# Patient Record
Sex: Male | Born: 1965
Health system: Southern US, Community
[De-identification: ages and names within clinical notes are randomized; demographics above are authoritative.]

## PROBLEM LIST (undated history)

## (undated) DIAGNOSIS — R7989 Other specified abnormal findings of blood chemistry: Secondary | ICD-10-CM

## (undated) DIAGNOSIS — F909 Attention-deficit hyperactivity disorder, unspecified type: Secondary | ICD-10-CM

## (undated) DIAGNOSIS — E785 Hyperlipidemia, unspecified: Secondary | ICD-10-CM

## (undated) DIAGNOSIS — N529 Male erectile dysfunction, unspecified: Secondary | ICD-10-CM

## (undated) DIAGNOSIS — R945 Abnormal results of liver function studies: Secondary | ICD-10-CM

## (undated) DIAGNOSIS — D569 Thalassemia, unspecified: Secondary | ICD-10-CM

## (undated) DIAGNOSIS — K8020 Calculus of gallbladder without cholecystitis without obstruction: Secondary | ICD-10-CM

## (undated) DIAGNOSIS — E349 Endocrine disorder, unspecified: Secondary | ICD-10-CM

## (undated) HISTORY — DX: Endocrine disorder, unspecified: E34.9

## (undated) HISTORY — PX: HEMORRHOID BANDING: SHX5850

## (undated) HISTORY — PX: HIP ARTHROSCOPY: SHX668

## (undated) HISTORY — PX: CHOLECYSTECTOMY: SHX55

## (undated) HISTORY — PX: VASECTOMY: SHX75

## (undated) HISTORY — DX: Other specified abnormal findings of blood chemistry: R79.89

## (undated) HISTORY — PX: SINUS EXPLORATION: SHX5214

## (undated) HISTORY — DX: Calculus of gallbladder without cholecystitis without obstruction: K80.20

## (undated) HISTORY — DX: Abnormal results of liver function studies: R94.5

## (undated) HISTORY — DX: Hyperlipidemia, unspecified: E78.5

---

## 2009-04-29 ENCOUNTER — Emergency Department (HOSPITAL_COMMUNITY): Admission: EM | Admit: 2009-04-29 | Discharge: 2009-04-29 | Payer: Self-pay | Admitting: Emergency Medicine

## 2013-04-27 ENCOUNTER — Encounter (HOSPITAL_COMMUNITY): Payer: Self-pay | Admitting: Emergency Medicine

## 2013-04-27 ENCOUNTER — Emergency Department (HOSPITAL_COMMUNITY)
Admission: EM | Admit: 2013-04-27 | Discharge: 2013-04-28 | Disposition: A | Discharge status: Home / Self Care | Payer: PRIVATE HEALTH INSURANCE | Attending: Emergency Medicine | Admitting: Emergency Medicine

## 2013-04-27 ENCOUNTER — Emergency Department (HOSPITAL_COMMUNITY): Payer: PRIVATE HEALTH INSURANCE

## 2013-04-27 DIAGNOSIS — R0602 Shortness of breath: Secondary | ICD-10-CM | POA: Insufficient documentation

## 2013-04-27 DIAGNOSIS — R109 Unspecified abdominal pain: Secondary | ICD-10-CM | POA: Insufficient documentation

## 2013-04-27 DIAGNOSIS — F909 Attention-deficit hyperactivity disorder, unspecified type: Secondary | ICD-10-CM | POA: Insufficient documentation

## 2013-04-27 DIAGNOSIS — N529 Male erectile dysfunction, unspecified: Secondary | ICD-10-CM | POA: Insufficient documentation

## 2013-04-27 DIAGNOSIS — Z79899 Other long term (current) drug therapy: Secondary | ICD-10-CM | POA: Insufficient documentation

## 2013-04-27 DIAGNOSIS — Z862 Personal history of diseases of the blood and blood-forming organs and certain disorders involving the immune mechanism: Secondary | ICD-10-CM | POA: Insufficient documentation

## 2013-04-27 DIAGNOSIS — R079 Chest pain, unspecified: Secondary | ICD-10-CM

## 2013-04-27 DIAGNOSIS — R0789 Other chest pain: Secondary | ICD-10-CM | POA: Insufficient documentation

## 2013-04-27 HISTORY — DX: Attention-deficit hyperactivity disorder, unspecified type: F90.9

## 2013-04-27 HISTORY — DX: Male erectile dysfunction, unspecified: N52.9

## 2013-04-27 HISTORY — DX: Thalassemia, unspecified: D56.9

## 2013-04-27 LAB — CBC WITH DIFFERENTIAL/PLATELET
Basophils Relative: 0 % (ref 0–1)
Eosinophils Relative: 2 % (ref 0–5)
HCT: 37.5 % — ABNORMAL LOW (ref 39.0–52.0)
Hemoglobin: 12.6 g/dL — ABNORMAL LOW (ref 13.0–17.0)
Lymphs Abs: 1.8 10*3/uL (ref 0.7–4.0)
MCH: 20.7 pg — ABNORMAL LOW (ref 26.0–34.0)
MCV: 61.7 fL — ABNORMAL LOW (ref 78.0–100.0)
Monocytes Absolute: 0.9 10*3/uL (ref 0.1–1.0)
RBC: 6.08 MIL/uL — ABNORMAL HIGH (ref 4.22–5.81)

## 2013-04-27 LAB — HEPATIC FUNCTION PANEL
ALT: 48 U/L (ref 0–53)
Alkaline Phosphatase: 67 U/L (ref 39–117)
Bilirubin, Direct: 0.2 mg/dL (ref 0.0–0.3)
Indirect Bilirubin: 1.3 mg/dL — ABNORMAL HIGH (ref 0.3–0.9)
Total Protein: 6.8 g/dL (ref 6.0–8.3)

## 2013-04-27 LAB — BASIC METABOLIC PANEL
CO2: 25 mEq/L (ref 19–32)
Calcium: 8.7 mg/dL (ref 8.4–10.5)
Creatinine, Ser: 0.89 mg/dL (ref 0.50–1.35)
Glucose, Bld: 96 mg/dL (ref 70–99)

## 2013-04-27 MED ORDER — GI COCKTAIL ~~LOC~~
30.0000 mL | Freq: Once | ORAL | Status: AC
  Administered 2013-04-27: 30 mL via ORAL
  Filled 2013-04-27: qty 30

## 2013-04-27 NOTE — ED Notes (Signed)
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

## 2013-04-27 NOTE — ED Provider Notes (Signed)
CSN: [id number]     Arrival date & time 04/27/13  2039 History   First MD Initiated Contact with Patient 04/27/13 2131     Chief Complaint  Patient presents with  . Abdominal Pain   (Consider location/radiation/quality/duration/timing/severity/associated sxs/prior Treatment) HPI Onset was sudden about two hours ago. 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  The pain is described as a tightness, current pain is mild, no radiation. Modifying factors: unknown.  Associated symptoms: mild SOB, no syncope, no emesis.  Recent medical care: here via EMS who gave 1 nitro without relief.   Past Medical History  Diagnosis Date  . ADHD (attention deficit hyperactivity disorder)   . ED (erectile dysfunction)   . Thalassemia    Past Surgical History  Procedure Laterality Date  . Sinus exploration    . Arthoscopic hip Left    History reviewed. No pertinent family history. History  Substance Use Topics  . Smoking status: Never Smoker   . Smokeless tobacco: Never Used  . Alcohol Use: No    Review of Systems Constitutional: Negative for fever.  Eyes: Negative for vision loss.  ENT: Negative for difficulty swallowing.  Cardiovascular: Positive for chest pain. Respiratory: Negative for respiratory distress.  Gastrointestinal:  Negative for vomiting.  Genitourinary: Negative for inability to void.  Musculoskeletal: Negative for gait problem.  Integumentary: Negative for rash.  Neurological: Negative for new focal weakness.     Allergies  Review of patient's allergies indicates no known allergies.  Home Medications   Current Outpatient Rx  Name  Route  Sig  Dispense  Refill  . atomoxetine (STRATTERA) 10 MG capsule   Oral   Take 10 mg by mouth daily.         . tadalafil (CIALIS) 5 MG tablet   Oral   Take 5 mg by mouth daily as needed for erectile dysfunction.          BP 105/71  Pulse 51  Temp(Src) 98.9 F (37.2 C) (Oral)  Resp  16  SpO2 96% Physical Exam Nursing note and vitals reviewed.  Constitutional: Pt is alert and appears stated age. Eyes: No injection, no scleral icterus. HENT: Atraumatic, airway open without erythema or exudate.  Respiratory: No respiratory distress. Equal breathing bilaterally. Chest: Mild bilateral lower rib tenderness.  Cardiovascular: Normal rate. Extremities warm and well perfused.  Abdomen: Soft, non-distended, mild upper abdominal pain. MSK: Extremities are atraumatic without deformity. Skin: No rash, no wounds.   Neuro: No motor nor sensory deficit.     ED Course  Procedures (including critical care time) Labs Review Labs Reviewed  CBC WITH DIFFERENTIAL - Abnormal; Notable for the following:    RBC 6.08 (*)    Hemoglobin 12.6 (*)    HCT 37.5 (*)    MCV 61.7 (*)    MCH 20.7 (*)    RDW 16.0 (*)    All other components within normal limits  HEPATIC FUNCTION PANEL - Abnormal; Notable for the following:    Total Bilirubin 1.5 (*)    Indirect Bilirubin 1.3 (*)    All other components within normal limits  BASIC METABOLIC PANEL  LIPASE, BLOOD  POCT I-STAT TROPONIN I  POCT I-STAT TROPONIN I   Imaging Review Dg Chest 2 View  04/27/2013   CLINICAL DATA:  Left-sided chest pain.  EXAM: CHEST  2 VIEW  COMPARISON:  None.  FINDINGS: The lungs are well-aerated and clear. There is no evidence of focal opacification,  pleural effusion or pneumothorax.  The heart is normal in size; the mediastinal contour is within normal limits. No acute osseous abnormalities are seen.  IMPRESSION: No acute cardiopulmonary process seen; no displaced rib fractures identified.   Electronically Signed   By: Roanna Raider M.D.   On: 04/27/2013 23:27    EKG Interpretation     Ventricular Rate:  60 PR Interval:  150 QRS Duration: 120 QT Interval:  490 QTC Calculation: 490 R Axis:   24 Text Interpretation:  Normal sinus rhythm RSR' or QR pattern in V1 suggests right ventricular conduction delay  Possible Inferior infarct , age undetermined Abnormal ECG            MDM   1. Chest pain   2. Abdominal pain    47 y.o. male w/ PMHx of ADHD, ED presents w/ upper abdominal pain, lower chest pain. Pt without cardiac risk factors. EKG without signs of acute ischemia. History slightly suspicious. Initial troponin neg. HEART score <3. Appropriate for delta troponin if negative, f/u with pcp to discuss provocative testing, return if worse. Pain not c/w PE and pt PERC neg. Not c/w dissection. CXR without PNA or PTX. Labs unremarkable as above. GI cocktail given. Pt updated.  Delta troponin neg. Pt and family updated. Pt doing well on re-eval. Informed of results. Pt will f/u with PCP. Counseling provided regarding diagnosis, treatment plan, follow up recommendations, and return precautions. Questions answered.       I independently viewed, interpreted, and used in my medical decision making all ordered lab and imaging tests. Medical Decision Making discussed with ED attending Dr. Bebe Shaggy.      Charm Barges, MD 04/28/13 [phone]

## 2013-04-28 LAB — POCT I-STAT TROPONIN I

## 2013-04-28 NOTE — ED Provider Notes (Signed)
I have personally seen and examined the patient.  I have discussed the plan of care with the resident.  I have reviewed the documentation on PMH/FH/Soc. History.  I have reviewed the documentation of the resident and agree.  I have reviewed and agree with the ECG interpretation(s) documented by the resident.   Pt low risk, well appearing, low suspicion for ACS/PE/Dissection Stable/appropriate for d/c home and outpatient followup  Joya Gaskins, MD 04/28/13 1413

## 2014-06-16 HISTORY — PX: CHOLECYSTECTOMY, LAPAROSCOPIC: SHX56

## 2014-06-30 HISTORY — PX: FUNCTIONAL ENDOSCOPIC SINUS SURGERY: SUR616

## 2014-06-30 HISTORY — PX: REPAIR DURAL / CSF LEAK: SUR1169

## 2014-09-25 ENCOUNTER — Ambulatory Visit: Payer: PRIVATE HEALTH INSURANCE | Admitting: Family Medicine

## 2015-02-19 IMAGING — CR DG CHEST 2V
2 series · 2 of 2 positions shown · non-contrast
Comparison: None.

CLINICAL DATA: Left-sided chest pain.

EXAM:
CHEST  2 VIEW

[w chest pa]
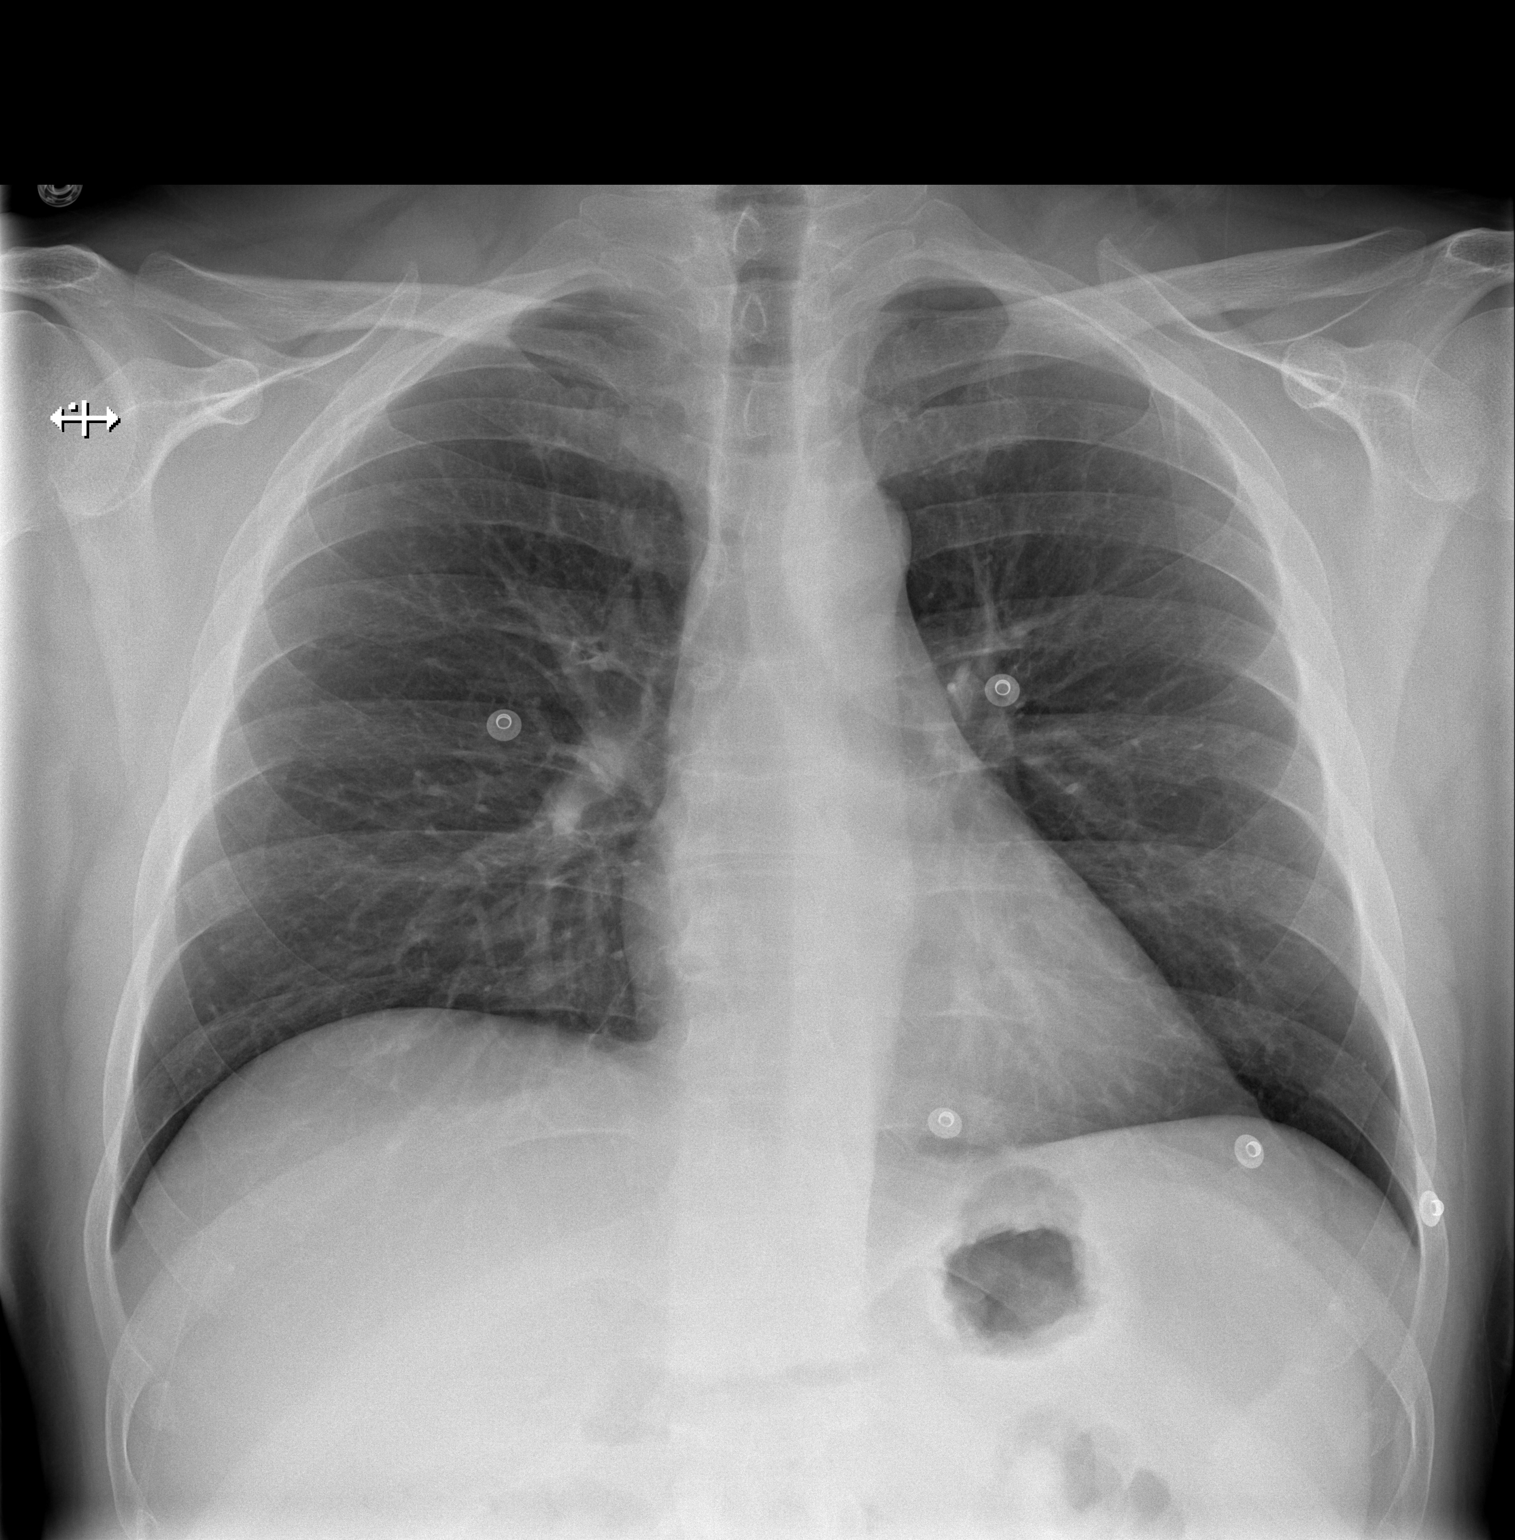

[w chest lat]
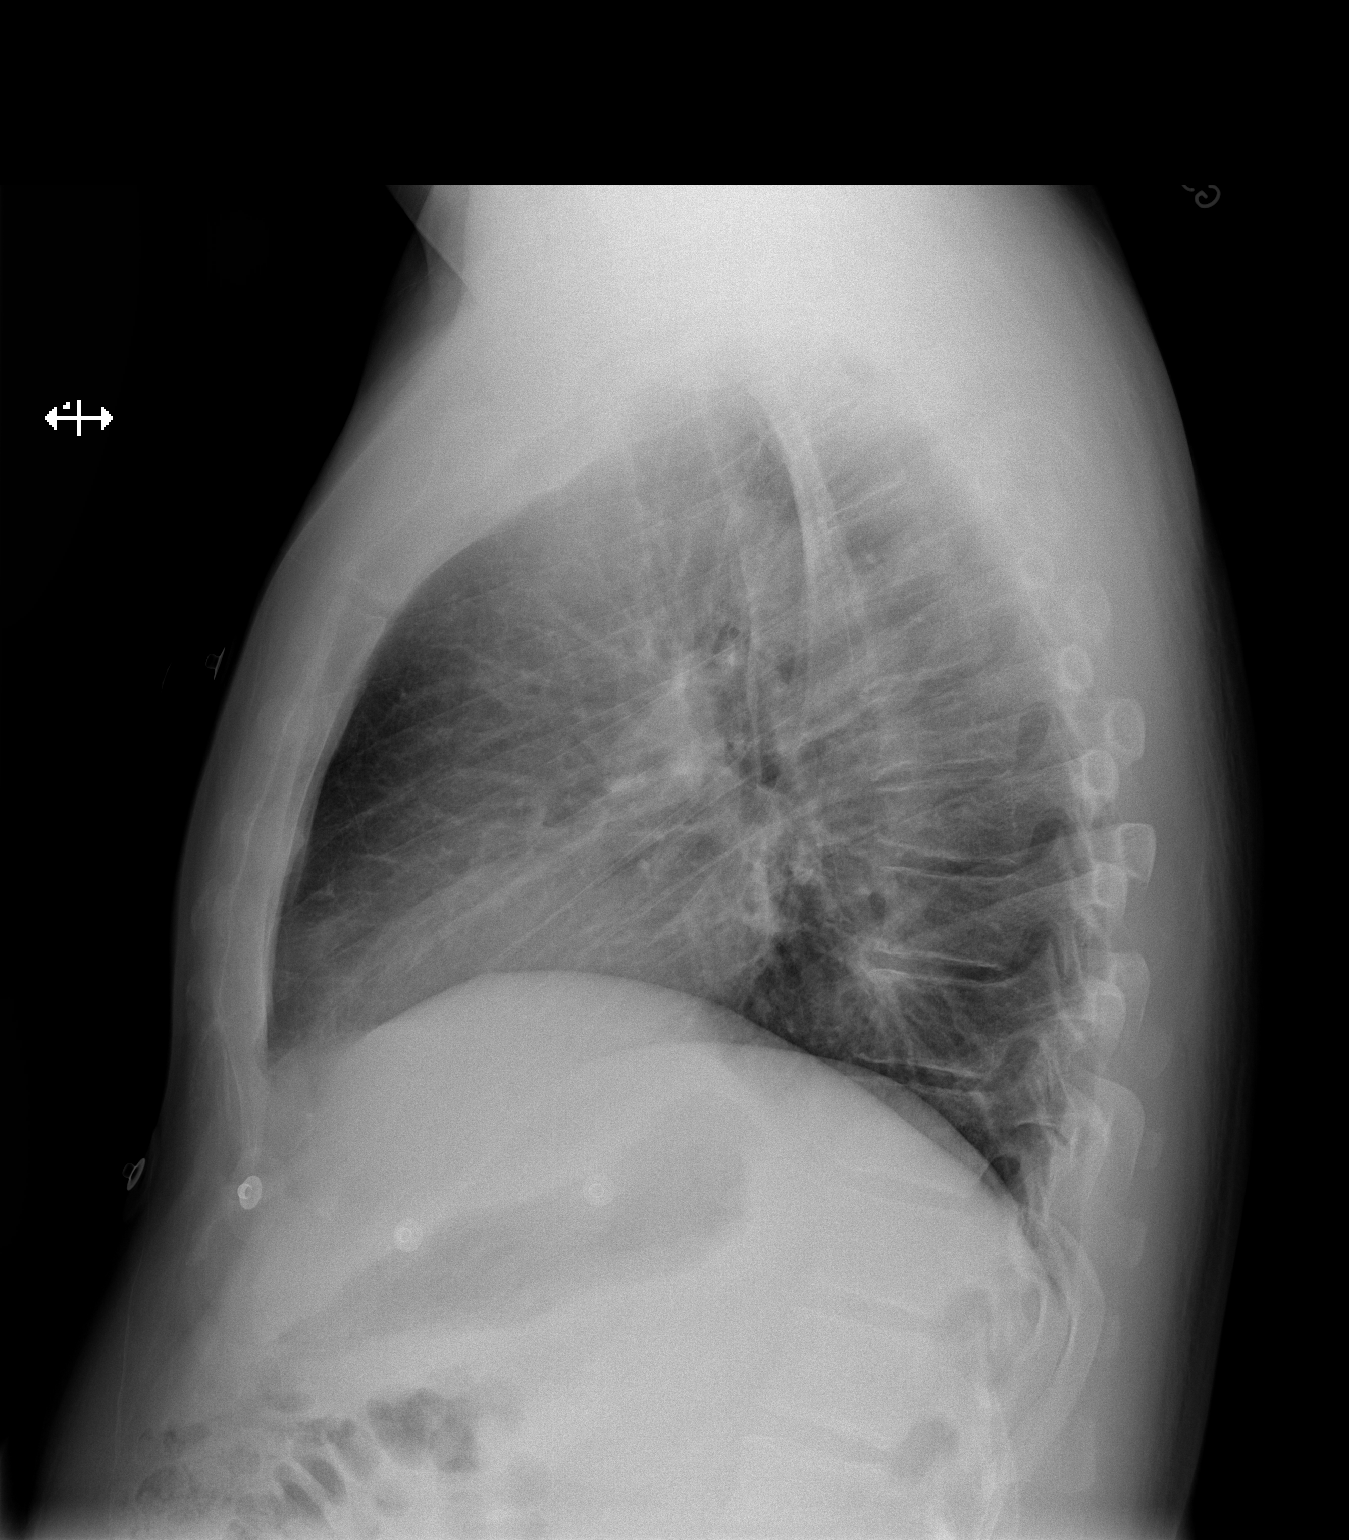

[2 of 2 positions shown; findings below may reference images not displayed]

FINDINGS: The lungs are well-aerated and clear. There is no evidence of focal
opacification, pleural effusion or pneumothorax.

The heart is normal in size; the mediastinal contour is within
normal limits. No acute osseous abnormalities are seen.
IMPRESSION: No acute cardiopulmonary process seen; no displaced rib fractures
identified.

## 2015-10-02 DIAGNOSIS — E782 Mixed hyperlipidemia: Secondary | ICD-10-CM | POA: Insufficient documentation

## 2017-01-15 ENCOUNTER — Ambulatory Visit (INDEPENDENT_AMBULATORY_CARE_PROVIDER_SITE_OTHER): Payer: BC Managed Care – PPO | Admitting: Family Medicine

## 2017-01-15 ENCOUNTER — Encounter: Payer: Self-pay | Admitting: Family Medicine

## 2017-01-15 VITALS — BP 122/90 | HR 72 | Ht 66.0 in | Wt 176.6 lb

## 2017-01-15 DIAGNOSIS — R030 Elevated blood-pressure reading, without diagnosis of hypertension: Secondary | ICD-10-CM | POA: Diagnosis not present

## 2017-01-15 NOTE — Progress Notes (Signed)
Patient was seen when computers were down.  All info entered later.  Chief complaint:  Elevated blood pressure [facility] at [facility] Pre[facility]employment physical yesterday.  BP was 132/105, 149/101.  He reports that he needs a lower blood pressure when he goes back next week, or a "plan".  HPI:  51 year old male presents to establish care, with complaint of elevated blood pressure reading yesterday.  He doesn't have a history of elevated BP.  He is a very healthy male, who has a lot of stress.  One of his main stressors is his type 1 diabetic middle-school son.  He recently had a bad episode of hypoglycemia from overdosing on insulin, and was hospitalized. He is doing better now, under the care of psych and therapist. 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 He has an insulin pump, but had to stop using continuous glucose monitor.  He and his wife check Joey's blood sugars at 11p, 3 am and then is up again at 6. He gets a total of 6 hours of sleep each night, always interrupted. 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    He used to be an avid cyclist, but he has been back in grad school and hasn't had the time. He has 1 year left to getting his Masters in hospital administration/healthcare management.  He has been walking about 20 minutes/day with his wife, but otherwise not getting the regular exercise that he would like to if he had the time.  This summer he has been very engaged and involved with the activities of his children, which he has enjoyed.  He denies any changes to his diet--follow a lowfat, low sodium, mostly plant-based diet, minimal meat.  Doesn't eat canned foods, rinses canned beans when used.  He roasts/salts his own nuts, using minimal salt.  PMH: h/o borderline cholesterol, but has  been controlled with diet/exercise.  He also reports having chronically elevated LFT's (stable x years). PMH in Epic (not available at time of his visit)--ADHD, ED, thalassemia.  These dx were not discussed or confirmed at today's visit.  PSH: lap cholecystectomy, L arthroscopic hip surgery. 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 Advertising account plannerGolden retriever. Parents live locally  FH: mother with osteoporosis.   Father with COPD (smoker), HN, obese Brother killed in MVA age 117, had some allergies PGF with CAD No known cancer or diabetes in other family members  Meds: [facility]  ROS:  No fever, chills, weight changes, headaches, dizziness, neuro symptoms, URI symptoms, GI or GU complaints, bleeding, bruising, rash, depression.  +stress per HPI. Other than some irritability and interrupted sleep, negative ROS   PHYSICAL EXAM:  BP 122/90   Pulse 72   Ht 5\' 6"  (1.676 m)   Wt 176 lb 9.6 oz (80.1 kg)   BMI 28.50 kg/m    Repeat BP by nurse (laying down) 124/90.  Had gone up to 140/100 when talking excitedly about his job  Well appearing, pleasant male in no distress HEENT: PERRL, EOMI, conjunctiva and sclera are clear. OP clear Neck: No lymphadenopathy or thyromegaly, no carotid bruit Heart:  Regular rate and rhythm, no murmurs, rubs, gallops or ectopy Lungs:  Clear bilaterally,  without wheezes, rales or ronchi Abdomen:  Soft, nontender, nondistended, no hepatosplenomegaly or masses, normal bowel sounds Extremities:  No clubbing, cyanosis or edema, 2+ pulses.  Neuro:  Alert and oriented x 3, cranial nerves grossly intact.  DTR's 2+ and symmetric.  Normal strength and sensation Back:  No spine or CVA tenderness Skin: no rashes or suspicious lesions Psych:  Normal mood, affect, hygiene and grooming, normal speech, eye contact  ASSESSMENT/PLAN:  Elevated blood pressure reading - no h/o HTN. +stressors.  Stress reduction, regular exercise, low sodium diet. Monitor elsewhere. Consider meds if persistently elevated  Discussed in detail low sodium diet, stress reduction, regular exercise. Encouraged him to check BP elsewhere, keep log.  Repeat a few minutes later if elevated. Discussed causes of HTN, including genetics. If he has persistently elevated BP's, may need meds. I'm not inclined to rush to start meds at this point (with BP only known to be elevated on two checks, yesterday and today).  I'll have him f/u in 6 weeks with list of BP's.  If remains elevated, will need EKG. In the interim, he will sign release of records to get records from his former PCP in [place].  If for some reason he doesn't pass pre-employment physical due to BP being elevated, we discussed potentially starting meds sooner, just for that reason, to not delay his starting work (which would only cause more stress, higher blood pressures). In that case, we discussed using amlodipine (likely 5mg , starting witih only 1/2 tablet) simply to get to goal quickly, without the expectation that he will remain on it long-term (as that wouldn't ordinarily be my first choice as antihypertensive otherwise)  40 min visit  Info put in AVS for his info.  I will contact his wife to remind him to sign up for MyChart and read this info.

## 2017-01-15 NOTE — Patient Instructions (Signed)
I'm so sorry for the inconvenience of not having computer access today. We discussed a lot today--I'll try and re-iterate the info here.  Please try and check your blood pressure periodically (at pharmacy, gym, or purchase a home cuff).  Keep a list of the blood pressures.  It is good to note the time you check it (morning vs evening), as well as any comments that could explain the value (headache, felt dizzy, stressed, rushed, in pain, etc). Bring this list to your next appointment.  Be sure to work on getting regular exercise, and other stress-reducing techniques/relaxation techniques. Continue to follow a healthy, low sodium diet.  I believe you should have signed a release of records form to get your former PCP's records sent here.  If your blood pressure remains elevated, I will likely want to check an EKG at your next visit.  I will also look at those records to determine if/when any additional blood testing is needed.   Hypertension Hypertension, commonly called high blood pressure, is when the force of blood pumping through the arteries is too strong. The arteries are the blood vessels that carry blood from the heart throughout the body. Hypertension forces the heart to work harder to pump blood and may cause arteries to become narrow or stiff. Having untreated or uncontrolled hypertension can cause heart attacks, strokes, kidney disease, and other problems. A blood pressure reading consists of a higher number over a lower number. Ideally, your blood pressure should be below 120/80. The first ("top") number is called the systolic pressure. It is a measure of the pressure in your arteries as your heart beats. The second ("bottom") number is called the diastolic pressure. It is a measure of the pressure in your arteries as the heart relaxes. What are the causes? The cause of this condition is not known. What increases the risk? Some risk factors for high blood pressure are under your control.  Others are not. Factors you can change  Smoking.  Having type 2 diabetes mellitus, high cholesterol, or both.  Not getting enough exercise or physical activity.  Being overweight.  Having too much fat, sugar, calories, or salt (sodium) in your diet.  Drinking too much alcohol. Factors that are difficult or impossible to change  Having chronic kidney disease.  Having a family history of high blood pressure.  Age. Risk increases with age.  Race. You may be at higher risk if you are African-American.  Gender. Men are at higher risk than women before age 50. After age 50, women are at higher risk than men.  Having obstructive sleep apnea.  Stress. What are the signs or symptoms? Extremely high blood pressure (hypertensive crisis) may cause:  Headache.  Anxiety.  Shortness of breath.  Nosebleed.  Nausea and vomiting.  Severe chest pain.  Jerky movements you cannot control (seizures).  How is this diagnosed? This condition is diagnosed by measuring your blood pressure while you are seated, with your arm resting on a surface. The cuff of the blood pressure monitor will be placed directly against the skin of your upper arm at the level of your heart. It should be measured at least twice using the same arm. Certain conditions can cause a difference in blood pressure between your right and left arms. Certain factors can cause blood pressure readings to be lower or higher than normal (elevated) for a short period of time:  When your blood pressure is higher when you are in a health care provider's office than  when you are at home, this is called white coat hypertension. Most people with this condition do not need medicines.  When your blood pressure is higher at home than when you are in a health care provider's office, this is called masked hypertension. Most people with this condition may need medicines to control blood pressure.  If you have a high blood pressure reading  during one visit or you have normal blood pressure with other risk factors:  You may be asked to return on a different day to have your blood pressure checked again.  You may be asked to monitor your blood pressure at home for 1 week or longer.  If you are diagnosed with hypertension, you may have other blood or imaging tests to help your health care provider understand your overall risk for other conditions. How is this treated? This condition is treated by making healthy lifestyle changes, such as eating healthy foods, exercising more, and reducing your alcohol intake. Your health care provider may prescribe medicine if lifestyle changes are not enough to get your blood pressure under control, and if:  Your systolic blood pressure is above 130.  Your diastolic blood pressure is above 80.  Your personal target blood pressure may vary depending on your medical conditions, your age, and other factors. Follow these instructions at home: Eating and drinking  Eat a diet that is high in fiber and potassium, and low in sodium, added sugar, and fat. An example eating plan is called the DASH (Dietary Approaches to Stop Hypertension) diet. To eat this way: ? Eat plenty of fresh fruits and vegetables. Try to fill half of your plate at each meal with fruits and vegetables. ? Eat whole grains, such as whole wheat pasta, brown rice, or whole grain bread. Fill about one quarter of your plate with whole grains. ? Eat or drink low-fat dairy products, such as skim milk or low-fat yogurt. ? Avoid fatty cuts of meat, processed or cured meats, and poultry with skin. Fill about one quarter of your plate with lean proteins, such as fish, chicken without skin, beans, eggs, and tofu. ? Avoid premade and processed foods. These tend to be higher in sodium, added sugar, and fat.  Reduce your daily sodium intake. Most people with hypertension should eat less than 1,500 mg of sodium a day.  Limit alcohol intake to no  more than 1 drink a day for nonpregnant women and 2 drinks a day for men. One drink equals 12 oz of beer, 5 oz of wine, or 1 oz of hard liquor. Lifestyle  Work with your health care provider to maintain a healthy body weight or to lose weight. Ask what an ideal weight is for you.  Get at least 30 minutes of exercise that causes your heart to beat faster (aerobic exercise) most days of the week. Activities may include walking, swimming, or biking.  Include exercise to strengthen your muscles (resistance exercise), such as pilates or lifting weights, as part of your weekly exercise routine. Try to do these types of exercises for 30 minutes at least 3 days a week.  Do not use any products that contain nicotine or tobacco, such as cigarettes and e-cigarettes. If you need help quitting, ask your health care provider.  Monitor your blood pressure at home as told by your health care provider.  Keep all follow-up visits as told by your health care provider. This is important. Medicines  Take over-the-counter and prescription medicines only as told by your  health care provider. Follow directions carefully. Blood pressure medicines must be taken as prescribed.  Do not skip doses of blood pressure medicine. Doing this puts you at risk for problems and can make the medicine less effective.  Ask your health care provider about side effects or reactions to medicines that you should watch for. Contact a health care provider if:  You think you are having a reaction to a medicine you are taking.  You have headaches that keep coming back (recurring).  You feel dizzy.  You have swelling in your ankles.  You have trouble with your vision. Get help right away if:  You develop a severe headache or confusion.  You have unusual weakness or numbness.  You feel faint.  You have severe pain in your chest or abdomen.  You vomit repeatedly.  You have trouble breathing. Summary  Hypertension is  when the force of blood pumping through your arteries is too strong. If this condition is not controlled, it may put you at risk for serious complications.  Your personal target blood pressure may vary depending on your medical conditions, your age, and other factors. For most people, a normal blood pressure is less than 120/80.  Hypertension is treated with lifestyle changes, medicines, or a combination of both. Lifestyle changes include weight loss, eating a healthy, low-sodium diet, exercising more, and limiting alcohol. This information is not intended to replace advice given to you by your health care provider. Make sure you discuss any questions you have with your health care provider. Document Released: 06/02/2005 Document Revised: 04/30/2016 Document Reviewed: 04/30/2016 Elsevier Interactive Patient Education  2018 [employer].   DASH Eating Plan DASH stands for "Dietary Approaches to Stop Hypertension." The DASH eating plan is a healthy eating plan that has been shown to reduce high blood pressure (hypertension). It may also reduce your risk for type 2 diabetes, heart disease, and stroke. The DASH eating plan may also help with weight loss. What are tips for following this plan? General guidelines  Avoid eating more than 2,300 mg (milligrams) of salt (sodium) a day. If you have hypertension, you may need to reduce your sodium intake to 1,500 mg a day.  Limit alcohol intake to no more than 1 drink a day for nonpregnant women and 2 drinks a day for men. One drink equals 12 oz of beer, 5 oz of wine, or 1 oz of hard liquor.  Work with your health care provider to maintain a healthy body weight or to lose weight. Ask what an ideal weight is for you.  Get at least 30 minutes of exercise that causes your heart to beat faster (aerobic exercise) most days of the week. Activities may include walking, swimming, or biking.  Work with your health care provider or diet and nutrition specialist  (dietitian) to adjust your eating plan to your individual calorie needs. Reading food labels  Check food labels for the amount of sodium per serving. Choose foods with less than 5 percent of the Daily Value of sodium. Generally, foods with less than 300 mg of sodium per serving fit into this eating plan.  To find whole grains, look for the word "whole" as the first word in the ingredient list. Shopping  Buy products labeled as "low-sodium" or "no salt added."  Buy fresh foods. Avoid canned foods and premade or frozen meals. Cooking  Avoid adding salt when cooking. Use salt-free seasonings or herbs instead of table salt or sea salt. Check with your health  care provider or pharmacist before using salt substitutes.  Do not fry foods. Cook foods using healthy methods such as baking, boiling, grilling, and broiling instead.  Cook with heart-healthy oils, such as olive, canola, soybean, or sunflower oil. Meal planning   Eat a balanced diet that includes: ? 5 or more servings of fruits and vegetables each day. At each meal, try to fill half of your plate with fruits and vegetables. ? Up to 6-8 servings of whole grains each day. ? Less than 6 oz of lean meat, poultry, or fish each day. A 3-oz serving of meat is about the same size as a deck of cards. One egg equals 1 oz. ? 2 servings of low-fat dairy each day. ? A serving of nuts, seeds, or beans 5 times each week. ? Heart-healthy fats. Healthy fats called Omega-3 fatty acids are found in foods such as flaxseeds and coldwater fish, like sardines, salmon, and mackerel.  Limit how much you eat of the following: ? Canned or prepackaged foods. ? Food that is high in trans fat, such as fried foods. ? Food that is high in saturated fat, such as fatty meat. ? Sweets, desserts, sugary drinks, and other foods with added sugar. ? Full-fat dairy products.  Do not salt foods before eating.  Try to eat at least 2 vegetarian meals each week.  Eat  more home-cooked food and less restaurant, buffet, and fast food.  When eating at a restaurant, ask that your food be prepared with less salt or no salt, if possible. What foods are recommended? The items listed may not be a complete list. Talk with your dietitian about what dietary choices are best for you. Grains Whole-grain or whole-wheat bread. Whole-grain or whole-wheat pasta. Brown rice. Orpah Cobbatmeal. Quinoa. Bulgur. Whole-grain and low-sodium cereals. Pita bread. Low-fat, low-sodium crackers. Whole-wheat flour tortillas. Vegetables Fresh or frozen vegetables (raw, steamed, roasted, or grilled). Low-sodium or reduced-sodium tomato and vegetable juice. Low-sodium or reduced-sodium tomato sauce and tomato paste. Low-sodium or reduced-sodium canned vegetables. Fruits All fresh, dried, or frozen fruit. Canned fruit in natural juice (without added sugar). Meat and other protein foods Skinless chicken or [place]. Ground chicken or [place]. Pork with fat trimmed off. Fish and seafood. Egg whites. Dried beans, peas, or lentils. Unsalted nuts, nut butters, and seeds. Unsalted canned beans. Lean cuts of beef with fat trimmed off. Low-sodium, lean deli meat. Dairy Low-fat (1%) or fat-free (skim) milk. Fat-free, low-fat, or reduced-fat cheeses. Nonfat, low-sodium ricotta or cottage cheese. Low-fat or nonfat yogurt. Low-fat, low-sodium cheese. Fats and oils Soft margarine without trans fats. Vegetable oil. Low-fat, reduced-fat, or light mayonnaise and salad dressings (reduced-sodium). Canola, safflower, olive, soybean, and sunflower oils. Avocado. Seasoning and other foods Herbs. Spices. Seasoning mixes without salt. Unsalted popcorn and pretzels. Fat-free sweets. What foods are not recommended? The items listed may not be a complete list. Talk with your dietitian about what dietary choices are best for you. Grains Baked goods made with fat, such as croissants, muffins, or some breads. Dry pasta or rice meal  packs. Vegetables Creamed or fried vegetables. Vegetables in a cheese sauce. Regular canned vegetables (not low-sodium or reduced-sodium). Regular canned tomato sauce and paste (not low-sodium or reduced-sodium). Regular tomato and vegetable juice (not low-sodium or reduced-sodium). Rosita FirePickles. Olives. Fruits Canned fruit in a light or heavy syrup. Fried fruit. Fruit in cream or butter sauce. Meat and other protein foods Fatty cuts of meat. Ribs. Fried meat. Tomasa BlaseBacon. Sausage. Bologna and other processed lunch meats. Salami.  Fatback. Hotdogs. Bratwurst. Salted nuts and seeds. Canned beans with added salt. Canned or smoked fish. Whole eggs or egg yolks. Chicken or [place] with skin. Dairy Whole or 2% milk, cream, and half-and-half. Whole or full-fat cream cheese. Whole-fat or sweetened yogurt. Full-fat cheese. Nondairy creamers. Whipped toppings. Processed cheese and cheese spreads. Fats and oils Butter. Stick margarine. Lard. Shortening. Ghee. Bacon fat. Tropical oils, such as coconut, palm kernel, or palm oil. Seasoning and other foods Salted popcorn and pretzels. Onion salt, garlic salt, seasoned salt, table salt, and sea salt. Worcestershire sauce. Tartar sauce. Barbecue sauce. Teriyaki sauce. Soy sauce, including reduced-sodium. Steak sauce. Canned and packaged gravies. Fish sauce. Oyster sauce. Cocktail sauce. Horseradish that you find on the shelf. Ketchup. Mustard. Meat flavorings and tenderizers. Bouillon cubes. Hot sauce and Tabasco sauce. Premade or packaged marinades. Premade or packaged taco seasonings. Relishes. Regular salad dressings. Where to find more information:  National Heart, Lung, and Blood Institute: PopSteam.is  American Heart Association: www.heart.org Summary  The DASH eating plan is a healthy eating plan that has been shown to reduce high blood pressure (hypertension). It may also reduce your risk for type 2 diabetes, heart disease, and stroke.  With the DASH eating  plan, you should limit salt (sodium) intake to 2,300 mg a day. If you have hypertension, you may need to reduce your sodium intake to 1,500 mg a day.  When on the DASH eating plan, aim to eat more fresh fruits and vegetables, whole grains, lean proteins, low-fat dairy, and heart-healthy fats.  Work with your health care provider or diet and nutrition specialist (dietitian) to adjust your eating plan to your individual calorie needs. This information is not intended to replace advice given to you by your health care provider. Make sure you discuss any questions you have with your health care provider. Document Released: 05/22/2011 Document Revised: 05/26/2016 Document Reviewed: 05/26/2016 Elsevier Interactive Patient Education  2017 [employer].

## 2017-02-03 ENCOUNTER — Encounter: Payer: Self-pay | Admitting: Family Medicine

## 2017-02-03 DIAGNOSIS — E785 Hyperlipidemia, unspecified: Secondary | ICD-10-CM | POA: Insufficient documentation

## 2017-02-03 DIAGNOSIS — D563 Thalassemia minor: Secondary | ICD-10-CM | POA: Insufficient documentation

## 2017-03-02 ENCOUNTER — Ambulatory Visit: Payer: BC Managed Care – PPO | Admitting: Family Medicine

## 2017-03-02 ENCOUNTER — Telehealth: Payer: Self-pay | Admitting: *Deleted

## 2017-03-02 NOTE — Telephone Encounter (Signed)
He no showed visit.  Please call him to see what the issue was. I'd like him rescheduled (fasting visit is recommended). 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

## 2017-03-02 NOTE — Telephone Encounter (Signed)

## 2017-03-03 NOTE — Telephone Encounter (Signed)
Called pt lmtrc needs to r/s.  (9/24 pm option?)

## 2017-03-13 DIAGNOSIS — M6281 Muscle weakness (generalized): Secondary | ICD-10-CM | POA: Diagnosis not present

## 2017-03-13 DIAGNOSIS — R030 Elevated blood-pressure reading, without diagnosis of hypertension: Secondary | ICD-10-CM | POA: Diagnosis not present

## 2017-03-13 DIAGNOSIS — R7989 Other specified abnormal findings of blood chemistry: Secondary | ICD-10-CM | POA: Diagnosis not present

## 2017-03-13 DIAGNOSIS — E291 Testicular hypofunction: Secondary | ICD-10-CM | POA: Diagnosis not present

## 2017-03-13 DIAGNOSIS — R5383 Other fatigue: Secondary | ICD-10-CM | POA: Diagnosis not present

## 2017-07-01 DIAGNOSIS — R030 Elevated blood-pressure reading, without diagnosis of hypertension: Secondary | ICD-10-CM | POA: Diagnosis not present

## 2017-07-01 DIAGNOSIS — R5383 Other fatigue: Secondary | ICD-10-CM | POA: Diagnosis not present

## 2017-07-01 DIAGNOSIS — E291 Testicular hypofunction: Secondary | ICD-10-CM | POA: Diagnosis not present

## 2017-07-01 DIAGNOSIS — R7989 Other specified abnormal findings of blood chemistry: Secondary | ICD-10-CM | POA: Diagnosis not present

## 2017-10-23 DIAGNOSIS — E291 Testicular hypofunction: Secondary | ICD-10-CM | POA: Diagnosis not present

## 2017-10-23 DIAGNOSIS — R7989 Other specified abnormal findings of blood chemistry: Secondary | ICD-10-CM | POA: Diagnosis not present

## 2017-10-23 DIAGNOSIS — R748 Abnormal levels of other serum enzymes: Secondary | ICD-10-CM | POA: Diagnosis not present

## 2017-10-23 DIAGNOSIS — R5383 Other fatigue: Secondary | ICD-10-CM | POA: Diagnosis not present

## 2017-11-29 DIAGNOSIS — S52125A Nondisplaced fracture of head of left radius, initial encounter for closed fracture: Secondary | ICD-10-CM | POA: Diagnosis not present

## 2017-12-02 DIAGNOSIS — S52125A Nondisplaced fracture of head of left radius, initial encounter for closed fracture: Secondary | ICD-10-CM | POA: Diagnosis not present

## 2017-12-14 DIAGNOSIS — S52125A Nondisplaced fracture of head of left radius, initial encounter for closed fracture: Secondary | ICD-10-CM | POA: Diagnosis not present

## 2018-01-11 DIAGNOSIS — S52125A Nondisplaced fracture of head of left radius, initial encounter for closed fracture: Secondary | ICD-10-CM | POA: Diagnosis not present

## 2018-03-10 DIAGNOSIS — H5213 Myopia, bilateral: Secondary | ICD-10-CM | POA: Diagnosis not present

## 2018-03-10 DIAGNOSIS — H524 Presbyopia: Secondary | ICD-10-CM | POA: Diagnosis not present

## 2018-11-04 ENCOUNTER — Ambulatory Visit: Payer: Self-pay | Admitting: Psychology

## 2018-11-26 DIAGNOSIS — F439 Reaction to severe stress, unspecified: Secondary | ICD-10-CM | POA: Diagnosis not present

## 2018-11-26 DIAGNOSIS — M6281 Muscle weakness (generalized): Secondary | ICD-10-CM | POA: Diagnosis not present

## 2018-11-26 DIAGNOSIS — R030 Elevated blood-pressure reading, without diagnosis of hypertension: Secondary | ICD-10-CM | POA: Diagnosis not present

## 2018-11-26 DIAGNOSIS — R5383 Other fatigue: Secondary | ICD-10-CM | POA: Diagnosis not present

## 2018-11-26 DIAGNOSIS — R7989 Other specified abnormal findings of blood chemistry: Secondary | ICD-10-CM | POA: Diagnosis not present

## 2018-11-26 DIAGNOSIS — R748 Abnormal levels of other serum enzymes: Secondary | ICD-10-CM | POA: Diagnosis not present

## 2019-02-04 DIAGNOSIS — E291 Testicular hypofunction: Secondary | ICD-10-CM | POA: Diagnosis not present

## 2019-02-11 DIAGNOSIS — G4709 Other insomnia: Secondary | ICD-10-CM | POA: Diagnosis not present

## 2019-02-11 DIAGNOSIS — R5383 Other fatigue: Secondary | ICD-10-CM | POA: Diagnosis not present

## 2019-02-11 DIAGNOSIS — R6882 Decreased libido: Secondary | ICD-10-CM | POA: Diagnosis not present

## 2019-02-11 DIAGNOSIS — E291 Testicular hypofunction: Secondary | ICD-10-CM | POA: Diagnosis not present

## 2019-04-01 DIAGNOSIS — G4709 Other insomnia: Secondary | ICD-10-CM | POA: Diagnosis not present

## 2019-04-01 DIAGNOSIS — R5383 Other fatigue: Secondary | ICD-10-CM | POA: Diagnosis not present

## 2019-04-01 DIAGNOSIS — E291 Testicular hypofunction: Secondary | ICD-10-CM | POA: Diagnosis not present

## 2019-04-01 DIAGNOSIS — R6882 Decreased libido: Secondary | ICD-10-CM | POA: Diagnosis not present

## 2019-04-13 DIAGNOSIS — R6882 Decreased libido: Secondary | ICD-10-CM | POA: Diagnosis not present

## 2019-04-13 DIAGNOSIS — R5383 Other fatigue: Secondary | ICD-10-CM | POA: Diagnosis not present

## 2019-04-13 DIAGNOSIS — G4709 Other insomnia: Secondary | ICD-10-CM | POA: Diagnosis not present

## 2019-04-13 DIAGNOSIS — E291 Testicular hypofunction: Secondary | ICD-10-CM | POA: Diagnosis not present

## 2019-04-28 DIAGNOSIS — R5383 Other fatigue: Secondary | ICD-10-CM | POA: Diagnosis not present

## 2019-04-28 DIAGNOSIS — E291 Testicular hypofunction: Secondary | ICD-10-CM | POA: Diagnosis not present

## 2019-06-02 DIAGNOSIS — R5383 Other fatigue: Secondary | ICD-10-CM | POA: Diagnosis not present

## 2019-06-02 DIAGNOSIS — E291 Testicular hypofunction: Secondary | ICD-10-CM | POA: Diagnosis not present

## 2019-06-02 DIAGNOSIS — R6882 Decreased libido: Secondary | ICD-10-CM | POA: Diagnosis not present

## 2019-06-02 DIAGNOSIS — G4709 Other insomnia: Secondary | ICD-10-CM | POA: Diagnosis not present

## 2019-06-13 DIAGNOSIS — N529 Male erectile dysfunction, unspecified: Secondary | ICD-10-CM | POA: Diagnosis not present

## 2019-06-13 DIAGNOSIS — E291 Testicular hypofunction: Secondary | ICD-10-CM | POA: Diagnosis not present

## 2019-06-13 DIAGNOSIS — G4709 Other insomnia: Secondary | ICD-10-CM | POA: Diagnosis not present

## 2019-06-13 DIAGNOSIS — R6882 Decreased libido: Secondary | ICD-10-CM | POA: Diagnosis not present

## 2019-06-13 DIAGNOSIS — R5382 Chronic fatigue, unspecified: Secondary | ICD-10-CM | POA: Diagnosis not present

## 2019-06-13 DIAGNOSIS — F5101 Primary insomnia: Secondary | ICD-10-CM | POA: Diagnosis not present

## 2019-06-13 DIAGNOSIS — M2559 Pain in other specified joint: Secondary | ICD-10-CM | POA: Diagnosis not present

## 2019-06-13 DIAGNOSIS — R5383 Other fatigue: Secondary | ICD-10-CM | POA: Diagnosis not present

## 2019-06-24 ENCOUNTER — Telehealth: Payer: Self-pay

## 2019-06-24 NOTE — Telephone Encounter (Signed)
[facility] faxed request for medical records from [facility] 06/24/19  [facility]

## 2019-06-27 ENCOUNTER — Telehealth: Payer: Self-pay | Admitting: Internal Medicine

## 2019-06-27 NOTE — Telephone Encounter (Signed)
Dr. Leone Wright, Lance Wright had his records from Dr. Charm Barges transferred to [facility] and requested you as a GI MD because he currently works at [employer].  He would like to be evaluated for iron-deficiency anemia.  Records will be sent to your office for review.

## 2019-06-29 ENCOUNTER — Encounter: Payer: Self-pay | Admitting: Internal Medicine

## 2019-06-29 NOTE — Telephone Encounter (Signed)
OK to schedule OV to eval iron-deficiency anemia   I am ok if he is with him to see an APP and be assigned to me for procedures/ and follow-up but if he wants to see me only that is ok  I do have some openings tomorrow which can be used even though they are follow-ups - if he is able though does not sound urgent

## 2019-07-07 ENCOUNTER — Ambulatory Visit (INDEPENDENT_AMBULATORY_CARE_PROVIDER_SITE_OTHER): Payer: 59 | Admitting: Physician Assistant

## 2019-07-07 ENCOUNTER — Encounter: Payer: Self-pay | Admitting: Physician Assistant

## 2019-07-07 VITALS — BP 128/80 | HR 55 | Temp 97.8°F | Wt 172.0 lb

## 2019-07-07 DIAGNOSIS — D509 Iron deficiency anemia, unspecified: Secondary | ICD-10-CM

## 2019-07-07 DIAGNOSIS — Z01818 Encounter for other preprocedural examination: Secondary | ICD-10-CM | POA: Diagnosis not present

## 2019-07-07 MED ORDER — SUPREP BOWEL PREP KIT 17.5-3.13-1.6 GM/177ML PO SOLN: 1.0000 | ORAL | 0 refills | Status: DC

## 2019-07-07 MED FILL — SUPREP BOWEL PREP KIT: 17.5-3.13-1 | 2 days supply | Qty: 354 | Fill #0

## 2019-07-07 NOTE — Progress Notes (Signed)
Subjective:    Patient ID: Lance Wright, male    DOB: 01-Jan-1966, 54 y.o.   MRN: [id number]  HPI Lance Wright is a 54 year old white male, new to GI today, referred by Lance Coy, NP for evaluation of iron deficiency. Records reviewed by Dr. Carlean Wright. Patient has history of hypertension, ADHD, thalassemia trait, dyslipidemia and is status post cholecystectomy. Patient did have 1 prior colonoscopy done in 2012 per Dr. Melina Wright for rectal bleeding and was found to have medium sized internal hemorrhoids.  He did not have any polyps. Patient says he has been having problems with fatigue and stress over the past several months due to multiple life changes over the past year and a half.  He had had multiple labs done. In October 2020 hemoglobin 15.4 hematocrit of 50.3 MCV of 64.6. In January 2021 hemoglobin 15.5, hematocrit 49.5 MCV of 64.8, platelets 249. Ferritin 17/total iron 117/TIBC 366/iron saturation of 32.  Hemoccults have not been done.  Patient denies any abdominal pain but says he has chronic problems with gassiness and some bloating after eating.  He says he stopped eating red meat after his gallbladder was removed because he did not feel that he could digested.  He has had some mild problems with constipation and about a month ago saw bright red blood with his bowel movements that went on for 3 or 4 days.  He says there was blood on the tissue, and with the bowel movement enough to turn the water red.  He has also noticed occasional dark stools. He is not having any rectal pain or discomfort currently.  He has occasional heartburn, no dysphagia or odynophagia. No regular NSAID use. Family history is negative for colon cancer, mother with history of colon polyps no family history of celiac disease, has 1 niece with Crohn's.  Review of Systems Pertinent positive and negative review of systems were noted in the above HPI section.  All other review of systems was otherwise  negative.  Outpatient Encounter Medications as of 07/07/2019  Medication Sig  . Barberry-Oreg Grape-Goldenseal (BERBERINE COMPLEX PO) Take by mouth daily.  . Cholecalciferol (VITAMIN D3 HIGH POTENCY PO) Take 5,000 mg by mouth daily.  Marland Kitchen co-enzyme Q-10 30 MG capsule Take 30 mg by mouth daily.  Marland Kitchen DHEA 10 MG CAPS Take by mouth daily.  Marland Kitchen omega-3 acid ethyl esters (LOVAZA) 1 g capsule Take 1 g by mouth daily.  . Probiotic Product (PROBIOTIC-10 PO) Take by mouth.  . Na Sulfate-K Sulfate-Mg Sulf (SUPREP BOWEL PREP KIT) 17.5-3.13-1.6 GM/177ML SOLN Take 1 kit by mouth as directed. For colonoscopy prep  . [DISCONTINUED] atomoxetine (STRATTERA) 10 MG capsule Take 10 mg by mouth daily.  . [DISCONTINUED] tadalafil (CIALIS) 5 MG tablet Take 5 mg by mouth daily as needed for erectile dysfunction.   No facility-administered encounter medications on file as of 07/07/2019.   No Known Allergies Patient Active Problem List   Diagnosis Date Noted  . Dyslipidemia 02/03/2017  . Thalassemia trait 02/03/2017   Social History   Socioeconomic History  . Marital status: Married    Spouse name: Not on file  . Number of children: Not on file  . Years of education: Not on file  . Highest education level: Not on file  Occupational History  . Not on file  Tobacco Use  . Smoking status: Never Smoker  . Smokeless tobacco: Never Used  Substance and Sexual Activity  . Alcohol use: No  . Drug use: No  . Sexual activity:  Yes    Partners: Female    Birth control/protection: Surgical  Other Topics Concern  . Not on file  Social History Narrative   Married.  Lives with wife, 2 daughters, 1 son, Restaurant manager, fast food. Son has type 1 DM.  Lance Wright does triathlons.   Parents live nearby in [place].   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 Difficulty of Paying Living  Expenses: Not on file  Food Insecurity:   . Worried About Charity fundraiser in the Last Year: Not on file  . Ran Out of Food in the Last Year: Not on file  Transportation Needs:   . Lack of Transportation (Medical): Not on file  . Lack of Transportation (Non-Medical): Not on file  Physical Activity:   . Days of Exercise per Week: Not on file  . Minutes of Exercise per Session: Not on file  Stress:   . Feeling of Stress : Not on file  Social Connections:   . Frequency of Communication with Friends and Family: Not on file  . Frequency of Social Gatherings with Friends and Family: Not on file  . Attends Religious Services: Not on file  . Active Member of Clubs or Organizations: Not on file  . Attends Archivist Meetings: Not on file  . Marital Status: Not on file  Intimate Partner Violence:   . Fear of Current or Ex-Partner: Not on file  . Emotionally Abused: Not on file  . Physically Abused: Not on file  . Sexually Abused: Not on file    Mr. Lance Wright family history includes Allergies in his brother; CAD in his paternal grandfather; COPD in his father; Diabetes type I in his son; Diverticulitis in his paternal grandfather; Hypertension in his father; Obesity in his father; Osteoporosis in his mother.      Objective:    Vitals:   07/07/19 0901  BP: 128/80  Pulse: (!) 55  Temp: 97.8 F (36.6 C)    Physical Exam Well-developed well-nourished /male in no acute distress.  , [id number], BMI 27.7  HEENT; nontraumatic normocephalic, EOMI, PER R LA, sclera anicteric. Oropharynx; not examined/mask/Covid Neck; supple, no JVD Cardiovascular; regular rate and rhythm with S1-S2, no murmur rub or gallop Pulmonary; Clear bilaterally Abdomen; soft, nontender, nondistended, no palpable mass or hepatosplenomegaly, bowel sounds are active Rectal; not done today Skin; benign exam, no jaundice rash or appreciable lesions Extremities; no clubbing cyanosis or edema skin warm and  dry Neuro/Psych; alert and oriented x4, grossly nonfocal mood and affect appropriate       Assessment & Plan:   #14 54 year old white male with new finding of mild iron deficiency with low ferritin.  Patient has a very low MCV, but normal hemoglobin at 15. History of thalassemia trait which may explain low MCV.  Rule out a component of chronic occult GI blood loss.  #2 recent episode of hematochezia, bright red blood with bowel movements over 3 to 4 days, last episode 1 month ago.  Rule out secondary to internal hemorrhoids, rule out occult neoplasm  #3 status post cholecystectomy #4.  History of internal hemorrhoids #5.  Hypertension #6.  Dyslipidemia  Plan; Patient will be scheduled for colonoscopy and EGD with Dr. Carlean Wright.  Both procedures were discussed in detail with the patient including indications risks and benefits and he is agreeable to  proceed. We will hold on iron supplementation until post procedures. Further recommendations pending findings at above.    Amy S Esterwood PA-C 07/07/2019   Cc: No ref. provider found

## 2019-07-07 NOTE — Patient Instructions (Signed)
If you are age 53 or older, your body mass index should be between 23-30. Your Body mass index is 27.76 kg/m. If this is out of the aforementioned range listed, please consider follow up with your Primary Care Provider.  You have been scheduled for an endoscopy and colonoscopy. Please follow the written instructions given to you at your visit today. Please pick up your prep supplies at the pharmacy within the next 1-3 days. If you use inhalers (even only as needed), please bring them with you on the day of your procedure.  Due to recent changes in healthcare laws, you may see the results of your imaging and laboratory studies on MyChart before your provider has had a chance to review them.  We understand that in some cases there may be results that are confusing or concerning to you. Not all laboratory results come back in the same time frame and the provider may be waiting for multiple results in order to interpret others.  Please give [place] 48 hours in order for your provider to thoroughly review all the results before contacting the office for clarification of your results.   Thank you for choosing me and [facility].  Amy Esterwood-PA

## 2019-07-14 ENCOUNTER — Encounter: Payer: Self-pay | Admitting: Internal Medicine

## 2019-07-14 ENCOUNTER — Other Ambulatory Visit: Payer: Self-pay | Admitting: Internal Medicine

## 2019-07-14 ENCOUNTER — Ambulatory Visit (INDEPENDENT_AMBULATORY_CARE_PROVIDER_SITE_OTHER): Payer: Self-pay

## 2019-07-14 DIAGNOSIS — Z1159 Encounter for screening for other viral diseases: Secondary | ICD-10-CM

## 2019-07-15 LAB — SARS CORONAVIRUS 2 (TAT 6-24 HRS): SARS Coronavirus 2: NEGATIVE

## 2019-07-18 ENCOUNTER — Other Ambulatory Visit: Payer: Self-pay

## 2019-07-18 ENCOUNTER — Ambulatory Visit (AMBULATORY_SURGERY_CENTER): Payer: 59 | Admitting: Internal Medicine

## 2019-07-18 ENCOUNTER — Encounter: Payer: Self-pay | Admitting: Internal Medicine

## 2019-07-18 VITALS — BP 111/70 | HR 56 | Temp 97.7°F | Resp 12 | Ht 66.0 in | Wt 172.0 lb

## 2019-07-18 DIAGNOSIS — K2950 Unspecified chronic gastritis without bleeding: Secondary | ICD-10-CM | POA: Diagnosis not present

## 2019-07-18 DIAGNOSIS — K449 Diaphragmatic hernia without obstruction or gangrene: Secondary | ICD-10-CM | POA: Diagnosis not present

## 2019-07-18 DIAGNOSIS — K3189 Other diseases of stomach and duodenum: Secondary | ICD-10-CM

## 2019-07-18 DIAGNOSIS — E785 Hyperlipidemia, unspecified: Secondary | ICD-10-CM | POA: Diagnosis not present

## 2019-07-18 DIAGNOSIS — E669 Obesity, unspecified: Secondary | ICD-10-CM | POA: Diagnosis not present

## 2019-07-18 DIAGNOSIS — K648 Other hemorrhoids: Secondary | ICD-10-CM

## 2019-07-18 DIAGNOSIS — K2210 Ulcer of esophagus without bleeding: Secondary | ICD-10-CM

## 2019-07-18 DIAGNOSIS — K228 Other specified diseases of esophagus: Secondary | ICD-10-CM | POA: Diagnosis not present

## 2019-07-18 DIAGNOSIS — K2970 Gastritis, unspecified, without bleeding: Secondary | ICD-10-CM

## 2019-07-18 DIAGNOSIS — E611 Iron deficiency: Secondary | ICD-10-CM | POA: Insufficient documentation

## 2019-07-18 DIAGNOSIS — K222 Esophageal obstruction: Secondary | ICD-10-CM

## 2019-07-18 DIAGNOSIS — D509 Iron deficiency anemia, unspecified: Secondary | ICD-10-CM

## 2019-07-18 HISTORY — DX: Iron deficiency: E61.1

## 2019-07-18 HISTORY — DX: Other hemorrhoids: K64.8

## 2019-07-18 MED ORDER — SODIUM CHLORIDE 0.9 % IV SOLN: 500.0000 mL | Freq: Once | INTRAVENOUS | Status: DC

## 2019-07-18 NOTE — Progress Notes (Signed)
Called to room to assist during endoscopic procedure.  Patient ID and intended procedure confirmed with present staff. Received instructions for my participation in the procedure from the performing physician.  

## 2019-07-18 NOTE — Op Note (Addendum)
[facility] Patient Name: Lance Wright Procedure Date: 07/18/2019 2:06 PM MRN: [id number] Endoscopist: Iva Boop , MD Age: 54 Referring MD:  Date of Birth: [birth year] Gender: Male Account #: [id number] Procedure:                Upper GI endoscopy Indications:              Iron deficiency w/o anemia ferritin 17 Medicines:                Propofol per Anesthesia, Monitored Anesthesia Care Procedure:                Pre-Anesthesia Assessment:                           - Prior to the procedure, a History and Physical                            was performed, and patient medications and                            allergies were reviewed. The patient's tolerance of                            previous anesthesia was also reviewed. The risks                            and benefits of the procedure and the sedation                            options and risks were discussed with the patient.                            All questions were answered, and informed consent                            was obtained. Prior Anticoagulants: The patient has                            taken no previous anticoagulant or antiplatelet                            agents. ASA Grade Assessment: II - A patient with                            mild systemic disease. After reviewing the risks                            and benefits, the patient was deemed in                            satisfactory condition to undergo the procedure.                           After obtaining informed consent, the endoscope was  passed under direct vision. Throughout the                            procedure, the patient's blood pressure, pulse, and                            oxygen saturations were monitored continuously. The                            Endoscope was introduced through the mouth, and                            advanced to the second part of duodenum. The upper                             GI endoscopy was accomplished without difficulty.                            The patient tolerated the procedure well. Scope In: Scope Out: Findings:                 LA Grade A (one or more mucosal breaks less than 5                            mm, not extending between tops of 2 mucosal folds)                            esophagitis with no bleeding was found at the                            gastroesophageal junction. Biopsies were taken with                            a cold forceps for histology. Verification of                            patient identification for the specimen was done.                            Estimated blood loss was minimal.                           A non-obstructing Schatzki ring was found at the                            gastroesophageal junction. Biopsies were taken with                            a cold forceps for histology. Verification of                            patient identification for the specimen was done.  Estimated blood loss was minimal.                           A 4 cm hiatal hernia was present.                           Patchy mild inflammation characterized by erosions                            and erythema was found in the gastric antrum.                            Biopsies were taken with a cold forceps for                            histology. Verification of patient identification                            for the specimen was done. Estimated blood loss was                            minimal.                           Patchy mucosal changes characterized by                            discoloration were found in the second portion of                            the duodenum. Biopsies were taken with a cold                            forceps for histology. Verification of patient                            identification for the specimen was done. Estimated                            blood loss was  minimal.                           The exam was otherwise without abnormality.                           The cardia and gastric fundus were otherwise normal                            on retroflexion. Complications:            No immediate complications. Estimated Blood Loss:     Estimated blood loss was minimal. Impression:               - LA Grade A reflux esophagitis with no bleeding.  Biopsied. Bottle 3                           - Non-obstructing Schatzki ring. Biopsied. Bottle 3                           - 4 cm hiatal hernia.                           - Gastritis. Biopsied. Bottle 2                           - Mucosal changes in the duodenum. Biopsied. Bottle                            - The examination was otherwise normal. Recommendation:           - Patient has a contact number available for                            emergencies. The signs and symptoms of potential                            delayed complications were discussed with the                            patient. Return to normal activities tomorrow.                            Written discharge instructions were provided to the                            patient.                           - Resume previous diet.                           - Continue present medications.                           - Await pathology results.                           - See the other procedure note for documentation of                            additional recommendations. Colonoscopy next                           CC KELLY CARPENTER NP                           DISCUSSION IN RECOVERY - RARE DYSPHAGIA AND +                            HEARTBURN WILL DISCUSS TX AFTER BX RESIULTS  IN Iva Boop, MD 07/18/2019 2:52:51 PM This report has been signed electronically.

## 2019-07-18 NOTE — Progress Notes (Signed)
Report to PACU, RN, vss, BBS= Clear.  

## 2019-07-18 NOTE — Progress Notes (Signed)
Pt. Reports he is a little lightheaded, sitting up in bed, given some sips of water, until he feels ready for discharge.  VS stable, skin warm and dry, pt. Not pale, flushed, or diaphoretic.

## 2019-07-18 NOTE — Progress Notes (Signed)
Vitals-DT Temp-JB  Pt's states no medical or surgical changes since previsit or office visit. 

## 2019-07-18 NOTE — Patient Instructions (Addendum)
There was slight inflammation and scar tissue at the gastroesophageal junction, some stomach inflammation (gastritis), and some slight color changes in the duodenum. All of these areas were biopsied.  The colonoscopy demonstrated your known hemorrhoids.  Otherwise normal.  Once I see the pathology results will contact you with further recommendations.  I appreciate the opportunity to care for you. Gatha Mayer, MD, [facility]  Esophagitis handout given to patient. Gastritis handout given to patient. Hiatal hernia handout given to patient. Hemorrhoid handout given to patient.  Resume previous diet. Continue present medications.  Repeat colonoscopy in 10 years for screening.  YOU HAD AN ENDOSCOPIC PROCEDURE TODAY AT [facility]:   Refer to the procedure report that was given to you for any specific questions about what was found during the examination.  If the procedure report does not answer your questions, please call your gastroenterologist to clarify.  If you requested that your care partner not be given the details of your procedure findings, then the procedure report has been included in a sealed envelope for you to review at your convenience later.  YOU SHOULD EXPECT: Some feelings of bloating in the abdomen. Passage of more gas than usual.  Walking can help get rid of the air that was put into your GI tract during the procedure and reduce the bloating. If you had a lower endoscopy (such as a colonoscopy or flexible sigmoidoscopy) you may notice spotting of blood in your stool or on the toilet paper. If you underwent a bowel prep for your procedure, you may not have a normal bowel movement for a few days.  Please Note:  You might notice some irritation and congestion in your nose or some drainage.  This is from the oxygen used during your procedure.  There is no need for concern and it should clear up in a day or so.  SYMPTOMS TO REPORT IMMEDIATELY:   Following lower  endoscopy (colonoscopy or flexible sigmoidoscopy):  Excessive amounts of blood in the stool  Significant tenderness or worsening of abdominal pains  Swelling of the abdomen that is new, acute  Fever of 100F or higher   Following upper endoscopy (EGD)  Vomiting of blood or coffee ground material  New chest pain or pain under the shoulder blades  Painful or persistently difficult swallowing  New shortness of breath  Fever of 100F or higher  Black, tarry-looking stools  For urgent or emergent issues, a gastroenterologist can be reached at any hour by calling [phone].   DIET:  We do recommend a small meal at first, but then you may proceed to your regular diet.  Drink plenty of fluids but you should avoid alcoholic beverages for 24 hours.  ACTIVITY:  You should plan to take it easy for the rest of today and you should NOT DRIVE or use heavy machinery until tomorrow (because of the sedation medicines used during the test).    FOLLOW UP: Our staff will call the number listed on your records 48-72 hours following your procedure to check on you and address any questions or concerns that you may have regarding the information given to you following your procedure. If we do not reach you, we will leave a message.  We will attempt to reach you two times.  During this call, we will ask if you have developed any symptoms of COVID 19. If you develop any symptoms (ie: fever, flu-like symptoms, shortness of breath, cough etc.) before then, please call [phone].  If you test positive for Covid 19 in the 2 weeks post procedure, please call and report this information to [place].    If any biopsies were taken you will be contacted by phone or by letter within the next 1-3 weeks.  Please call us at [phone] if you have not heard about the biopsies in 3 weeks.    SIGNATURES/CONFIDENTIALITY: You and/or your care partner have signed paperwork which will be entered into your electronic medical  record.  These signatures attest to the fact that that the information above on your After Visit Summary has been reviewed and is understood.  Full responsibility of the confidentiality of this discharge information lies with you and/or your care-partner.  Await pathology results.

## 2019-07-18 NOTE — Op Note (Addendum)
[facility] Patient Name: Lance Wright Procedure Date: 07/18/2019 2:06 PM MRN: [id number] Endoscopist: Iva Boop , MD Age: 54 Referring MD:  Date of Birth: May 04, 1966 Gender: Male Account #: [id number] Procedure:                Colonoscopy Indications:              Iron deficiency no anemioa ferritin 17 Medicines:                Propofol per Anesthesia, Monitored Anesthesia Care Procedure:                Pre-Anesthesia Assessment:                           - Prior to the procedure, a History and Physical                            was performed, and patient medications and                            allergies were reviewed. The patient's tolerance of                            previous anesthesia was also reviewed. The risks                            and benefits of the procedure and the sedation                            options and risks were discussed with the patient.                            All questions were answered, and informed consent                            was obtained. Prior Anticoagulants: The patient has                            taken no previous anticoagulant or antiplatelet                            agents. ASA Grade Assessment: II - A patient with                            mild systemic disease. After reviewing the risks                            and benefits, the patient was deemed in                            satisfactory condition to undergo the procedure.                           After obtaining informed consent, the colonoscope  was passed under direct vision. Throughout the                            procedure, the patient's blood pressure, pulse, and                            oxygen saturations were monitored continuously. The                            Colonoscope was introduced through the anus and                            advanced to the the terminal ileum, with   identification of the appendiceal orifice and IC                            valve. The colonoscopy was performed without                            difficulty. The patient tolerated the procedure                            well. The quality of the bowel preparation was                            excellent. The bowel preparation used was SUPREP                            via split dose instruction. The terminal ileum,                            ileocecal valve, appendiceal orifice, and rectum                            were photographed. Scope In: 2:28:16 PM Scope Out: 2:38:19 PM Scope Withdrawal Time: 0 hours 8 minutes 1 second  Total Procedure Duration: 0 hours 10 minutes 3 seconds  Findings:                 The perianal and digital rectal examinations were                            normal. Pertinent negatives include normal prostate                            (size, shape, and consistency).                           The terminal ileum appeared normal.                           Internal hemorrhoids were found.                           The exam was otherwise without abnormality on  direct and retroflexion views. Complications:            No immediate complications. Estimated Blood Loss:     Estimated blood loss: none. Impression:               - The examined portion of the ileum was normal.                           - Internal hemorrhoids.                           - The examination was otherwise normal on direct                            and retroflexion views.                           - No specimens collected. Recommendation:           - Patient has a contact number available for                            emergencies. The signs and symptoms of potential                            delayed complications were discussed with the                            patient. Return to normal activities tomorrow.                            Written discharge instructions  were provided to the                            patient.                           - Resume previous diet.                           - Continue present medications.                           - Repeat colonoscopy in 10 years for screening                            purposes.                           - await EGD biopsy results                           - if having regular bleeding then consider                            hemorrhoid banding DISCUSSED IN RECOVERY - WILL  SCHJEDULE A BANDING APPOINTMENT AT THE TIME WE                            CONTACT HIM WITH BIOPSY RESULTS                           CC Dallie Piles, NP Iva Boop, MD 07/18/2019 2:57:49 PM This report has been signed electronically.

## 2019-07-19 DIAGNOSIS — E291 Testicular hypofunction: Secondary | ICD-10-CM | POA: Diagnosis not present

## 2019-07-19 DIAGNOSIS — M2559 Pain in other specified joint: Secondary | ICD-10-CM | POA: Diagnosis not present

## 2019-07-19 DIAGNOSIS — R5382 Chronic fatigue, unspecified: Secondary | ICD-10-CM | POA: Diagnosis not present

## 2019-07-19 DIAGNOSIS — G4709 Other insomnia: Secondary | ICD-10-CM | POA: Diagnosis not present

## 2019-07-20 ENCOUNTER — Telehealth: Payer: Self-pay

## 2019-07-20 ENCOUNTER — Telehealth: Payer: Self-pay | Admitting: *Deleted

## 2019-07-20 NOTE — Telephone Encounter (Signed)
No answer for post procedure call back, left message for patient to cal back with questions or concerns.

## 2019-07-20 NOTE — Telephone Encounter (Signed)
No answer, left message to call back later today, B.Amilee Janvier RN. 

## 2019-08-08 ENCOUNTER — Ambulatory Visit (INDEPENDENT_AMBULATORY_CARE_PROVIDER_SITE_OTHER): Payer: 59 | Admitting: Internal Medicine

## 2019-08-08 ENCOUNTER — Encounter: Payer: Self-pay | Admitting: Internal Medicine

## 2019-08-08 ENCOUNTER — Other Ambulatory Visit: Payer: Self-pay

## 2019-08-08 VITALS — BP 132/88 | HR 84 | Temp 97.7°F | Ht 66.5 in | Wt 191.4 lb

## 2019-08-08 DIAGNOSIS — K642 Third degree hemorrhoids: Secondary | ICD-10-CM

## 2019-08-08 NOTE — Patient Instructions (Addendum)
HEMORRHOID BANDING PROCEDURE    FOLLOW-UP CARE   1. The procedure you have had should have been relatively painless since the banding of the area involved does not have nerve endings and there is no pain sensation.  The rubber band cuts off the blood supply to the hemorrhoid and the band may fall off as soon as 48 hours after the banding (the band may occasionally be seen in the toilet bowl following a bowel movement). You may notice a temporary feeling of fullness in the rectum which should respond adequately to plain Tylenol or Motrin.  2. Following the banding, avoid strenuous exercise that evening and resume full activity the next day.  A sitz bath (soaking in a warm tub) or bidet is soothing, and can be useful for cleansing the area after bowel movements.     3. To avoid constipation, take two tablespoons of natural wheat bran, natural oat bran, flax, Benefiber or any over the counter fiber supplement and increase your water intake to 7-8 glasses daily.    4. Unless you have been prescribed anorectal medication, do not put anything inside your rectum for two weeks: No suppositories, enemas, fingers, etc.  5. Occasionally, you may have more bleeding than usual after the banding procedure.  This is often from the untreated hemorrhoids rather than the treated one.  Don't be concerned if there is a tablespoon or so of blood.  If there is more blood than this, lie flat with your bottom higher than your head and apply an ice pack to the area. If the bleeding does not stop within a half an hour or if you feel faint, call our office at [phone] or go to the emergency room.  6. Problems are not common; however, if there is a substantial amount of bleeding, severe pain, chills, fever or difficulty passing urine (very rare) or other problems, you should call us at [phone] or report to the nearest emergency room.  7. Do not stay seated continuously for more than 2-3 hours for a day or two  after the procedure.  Tighten your buttock muscles 10-15 times every two hours and take 10-15 deep breaths every 1-2 hours.  Do not spend more than a few minutes on the toilet if you cannot empty your bowel; instead re-visit the toilet at a later time.    Please take a tablespoon of Benefiber daily.   We will see you back on March 8th at 3:50pm for additional banding.   I appreciate the opportunity to care for you. Stan Head, MD, [facility]

## 2019-08-08 NOTE — Progress Notes (Signed)
   Hemorrhoid ligation procedure  Indications chronic recurrent rectal bleeding  Colonoscopy performed 07/18/2019 showing the hemorrhoids   Rectal exam reveals some prolapsed skin tag/external hemorrhoids and a grade 3 right posterior internal hemorrhoid with inflammation  Anoscopy exam confirms the same as well as grade 2 right anterior and left lateral internal hemorrhoids PROCEDURE NOTE: The patient presents with symptomatic grade 3 hemorrhoids, requesting rubber band ligation of his/her hemorrhoidal disease.  All risks, benefits and alternative forms of therapy were described and informed consent was obtained.   The anorectum was pre-medicated with 0.125% NTG and 5% lidocaine The decision was made to band the RP internal hemorrhoid, and the [facility] O'Regan System was used to perform band ligation without complication.  Digital anorectal examination was then performed to assure proper positioning of the band, and to adjust the banded tissue as required.  The patient was discharged home without pain or other issues.  Dietary and behavioral recommendations were given and along with follow-up instructions.     The following adjunctive treatments were recommended:  Benefiber 1 tbsp nightly/daily  Need to remind him to take iron supplementation when he returns  The patient will return in about 2 weeks for  follow-up and possible additional banding as required. No complications were encountered and the patient tolerated the procedure well.  I appreciate the opportunity to care for this patient.

## 2019-08-08 NOTE — Assessment & Plan Note (Signed)
Right posterior banded return in 2 weeks reassess repeat banding other columns Benefiber nightly

## 2019-08-22 ENCOUNTER — Encounter: Payer: 59 | Admitting: Internal Medicine

## 2019-08-30 ENCOUNTER — Encounter: Payer: 59 | Admitting: Internal Medicine

## 2019-09-08 DIAGNOSIS — E291 Testicular hypofunction: Secondary | ICD-10-CM | POA: Diagnosis not present

## 2019-09-08 DIAGNOSIS — R5382 Chronic fatigue, unspecified: Secondary | ICD-10-CM | POA: Diagnosis not present

## 2019-09-08 DIAGNOSIS — G4709 Other insomnia: Secondary | ICD-10-CM | POA: Diagnosis not present

## 2019-09-08 LAB — IRON,TIBC AND FERRITIN PANEL: Ferritin: 24

## 2019-09-08 LAB — CBC AND DIFFERENTIAL
HCT: 52 (ref 41–53)
Hemoglobin: 15.8 (ref 13.5–17.5)
Platelets: 278 (ref 150–399)
WBC: 7.9

## 2019-09-08 LAB — CBC: RBC: 7.87 — AB (ref 3.87–5.11)

## 2019-09-08 LAB — TSH: TSH: 1.01 (ref 0.41–5.90)

## 2019-09-23 DIAGNOSIS — R5382 Chronic fatigue, unspecified: Secondary | ICD-10-CM | POA: Diagnosis not present

## 2019-09-23 DIAGNOSIS — E291 Testicular hypofunction: Secondary | ICD-10-CM | POA: Diagnosis not present

## 2019-09-23 DIAGNOSIS — R790 Abnormal level of blood mineral: Secondary | ICD-10-CM | POA: Diagnosis not present

## 2019-09-27 ENCOUNTER — Other Ambulatory Visit: Payer: Self-pay

## 2019-09-27 NOTE — Patient Instructions (Addendum)
Health Maintenance Due  Topic Date Due  . HIV Screening  Never done    No flowsheet data found.   Health Maintenance, Male Adopting a healthy lifestyle and getting preventive care are important in promoting health and wellness. Ask your health care provider about:  The right schedule for you to have regular tests and exams.  Things you can do on your own to prevent diseases and keep yourself healthy. What should I know about diet, weight, and exercise? Eat a healthy diet   Eat a diet that includes plenty of vegetables, fruits, low-fat dairy products, and lean protein.  Do not eat a lot of foods that are high in solid fats, added sugars, or sodium. Maintain a healthy weight Body mass index (BMI) is a measurement that can be used to identify possible weight problems. It estimates body fat based on height and weight. Your health care provider can help determine your BMI and help you achieve or maintain a healthy weight. Get regular exercise Get regular exercise. This is one of the most important things you can do for your health. Most adults should:  Exercise for at least 150 minutes each week. The exercise should increase your heart rate and make you sweat (moderate-intensity exercise).  Do strengthening exercises at least twice a week. This is in addition to the moderate-intensity exercise.  Spend less time sitting. Even light physical activity can be beneficial. Watch cholesterol and blood lipids Have your blood tested for lipids and cholesterol at 53 years of age, then have this test every 5 years. You may need to have your cholesterol levels checked more often if:  Your lipid or cholesterol levels are high.  You are older than 53 years of age.  You are at high risk for heart disease. What should I know about cancer screening? Many types of cancers can be detected early and may often be prevented. Depending on your health history and family history, you may need to have  cancer screening at various ages. This may include screening for:  Colorectal cancer.  Prostate cancer.  Skin cancer.  Lung cancer. What should I know about heart disease, diabetes, and high blood pressure? Blood pressure and heart disease  High blood pressure causes heart disease and increases the risk of stroke. This is more likely to develop in people who have high blood pressure readings, are of African descent, or are overweight.  Talk with your health care provider about your target blood pressure readings.  Have your blood pressure checked: ? Every 3-5 years if you are 53 years of age. ? Every year if you are 53 years old or older.  If you are between the ages of 59 and 32 and are a current or former smoker, ask your health care provider if you should have a one-time screening for abdominal aortic aneurysm (AAA). Diabetes Have regular diabetes screenings. This checks your fasting blood sugar level. Have the screening done:  Once every three years after age 53 if you are at a normal weight and have a low risk for diabetes.  More often and at a younger age if you are overweight or have a high risk for diabetes. What should I know about preventing infection? Hepatitis B If you have a higher risk for hepatitis B, you should be screened for this virus. Talk with your health care provider to find out if you are at risk for hepatitis B infection. Hepatitis C Blood testing is recommended for:  Everyone born from  1945 through [birth year].  Anyone with known risk factors for hepatitis C. Sexually transmitted infections (STIs)  You should be screened each year for STIs, including gonorrhea and chlamydia, if: ? You are sexually active and are younger than 53 years of age. ? You are older than 53 years of age and your health care provider tells you that you are at risk for this type of infection. ? Your sexual activity has changed since you were last screened, and you are at increased  risk for chlamydia or gonorrhea. Ask your health care provider if you are at risk.  Ask your health care provider about whether you are at high risk for HIV. Your health care provider may recommend a prescription medicine to help prevent HIV infection. If you choose to take medicine to prevent HIV, you should first get tested for HIV. You should then be tested every 3 months for as long as you are taking the medicine. Follow these instructions at home: Lifestyle  Do not use any products that contain nicotine or tobacco, such as cigarettes, e-cigarettes, and chewing tobacco. If you need help quitting, ask your health care provider.  Do not use street drugs.  Do not share needles.  Ask your health care provider for help if you need support or information about quitting drugs. Alcohol use  Do not drink alcohol if your health care provider tells you not to drink.  If you drink alcohol: ? Limit how much you have to 0-2 drinks a day. ? Be aware of how much alcohol is in your drink. In the [place] one drink equals one 12 oz bottle of beer (355 mL), one 5 oz glass of wine (148 mL), or one 1 oz glass of hard liquor (44 mL). General instructions  Schedule regular health, dental, and eye exams.  Stay current with your vaccines.  Tell your health care provider if: ? You often feel depressed. ? You have ever been abused or do not feel safe at home. Summary  Adopting a healthy lifestyle and getting preventive care are important in promoting health and wellness.  Follow your health care provider's instructions about healthy diet, exercising, and getting tested or screened for diseases.  Follow your health care provider's instructions on monitoring your cholesterol and blood pressure. This information is not intended to replace advice given to you by your health care provider. Make sure you discuss any questions you have with your health care provider. Document Revised: 05/26/2018 Document  Reviewed: 05/26/2018 Elsevier Patient Education  2020 [employer].

## 2019-09-28 ENCOUNTER — Encounter: Payer: Self-pay | Admitting: Family Medicine

## 2019-09-28 ENCOUNTER — Ambulatory Visit (INDEPENDENT_AMBULATORY_CARE_PROVIDER_SITE_OTHER): Payer: 59 | Admitting: Family Medicine

## 2019-09-28 VITALS — BP 128/80 | HR 68 | Temp 97.4°F | Ht 67.75 in | Wt 187.4 lb

## 2019-09-28 DIAGNOSIS — Z0000 Encounter for general adult medical examination without abnormal findings: Secondary | ICD-10-CM

## 2019-09-28 DIAGNOSIS — E785 Hyperlipidemia, unspecified: Secondary | ICD-10-CM

## 2019-09-28 DIAGNOSIS — E611 Iron deficiency: Secondary | ICD-10-CM

## 2019-09-28 NOTE — Progress Notes (Signed)
DOMINICK MORELLA is a 54 y.o. male  Chief Complaint  Patient presents with  . Establish Care    Pt here for physical.  Pt brought recent labs with him.    HPI: STANISLAV GERVASE is a 55 y.o. male here as a new patient to establish care with our office and for annual CPE. 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 3 children, recently divorced.   Last Colonoscopy: 07/2019 (Dr. Leone Payor with [facility]) Diet/Exercise: improved, has lost weight 10lbs. He is working out 5 day/wk Dental: UTD Vision: due in 3-6 mo   Med refills needed today: none  Iron 1x/day. Pt will start that in the next few days. Pt brings labs from past 6+ mo and will review and scan into Epic today. Pt notes increased energy, overall feeling better and getting closer to normal self.  Pt was on cialis in the past for ED. Difficulty obtain and maintaining an erection. Recently divorced and no significant other so does not need this currently.  Past Medical History:  Diagnosis Date  . ADHD (attention deficit hyperactivity disorder)   . Bleeding internal hemorrhoids 07/18/2019  . ED (erectile dysfunction)   . Elevated LFTs    mild, chronic per pt. Reportedly normal w/u (no records available). 09/2016 SGPT/ALT 52, T bili 1.9  . Gallstones   . Hyperlipidemia    low HDL, elevated TG  . Iron deficiency 07/18/2019   Ferritin 17  . Testosterone deficiency   . Thalassemia     Past Surgical History:  Procedure Laterality Date  . CHOLECYSTECTOMY, LAPAROSCOPIC  2016  . COLONOSCOPY  2012   hemorrhoids  . FUNCTIONAL ENDOSCOPIC SINUS SURGERY Right 06/30/2014  . HEMORRHOID BANDING    . HIP ARTHROSCOPY Left 2009-2010  . 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 SINUS EXPLORATION     one surgery was complicated by CSF leak, requiring patch  . VASECTOMY      Social History   Socioeconomic History  . Marital status: Divorced    Spouse name: Not on file  . Number of children: Not on file  . Years of education: Not on  file  . Highest education level: Not on file  Occupational History  . Not on file  Tobacco Use  . Smoking status: Never Smoker  . Smokeless tobacco: Never Used  Substance and Sexual Activity  . Alcohol use: No  . Drug use: No  . Sexual activity: Yes    Partners: Female    Birth control/protection: Surgical  Other Topics Concern  . Not on file  Social History Narrative   Married.  Lives with wife, 2 daughters, 1 son, Advertising account planner. Son has type 1 DM.  Sophia does triathlons.   Parents live nearby in [place].   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 Difficulty of Paying Living Expenses:   Food Insecurity:   . Worried About Programme researcher, broadcasting/film/video in the Last Year:   . Barista in the Last Year:   Transportation Needs:   . Freight forwarder (Medical):   Marland Kitchen Lack of Transportation (Non-Medical):   Physical Activity:   . Days of Exercise per Week:   . Minutes of Exercise per Session:   Stress:   . Feeling of Stress :   Social Connections:   .  Frequency of Communication with Friends and Family:   . Frequency of Social Gatherings with Friends and Family:   . Attends Religious Services:   . Active Member of Clubs or Organizations:   . Attends Archivist Meetings:   Marland Kitchen Marital Status:   Intimate Partner Violence:   . Fear of Current or Ex-Partner:   . Emotionally Abused:   Marland Kitchen Physically Abused:   . Sexually Abused:     Family History  Problem Relation Age of Onset  . Osteoporosis Mother   . COPD Father   . Hypertension Father   . Obesity Father   . Allergies Brother   . Diabetes type I Son   . CAD Paternal Grandfather   . Diverticulitis Paternal Grandfather   . Colon cancer Neg Hx   . Esophageal cancer Neg Hx   . Liver cancer Neg Hx   . Stomach cancer Neg Hx      Immunization History    Administered Date(s) Administered  . Tdap 12/24/2010    Outpatient Encounter Medications as of 09/28/2019  Medication Sig  . Barberry-Oreg Grape-Goldenseal (BERBERINE COMPLEX PO) Take by mouth daily.  . Cholecalciferol (VITAMIN D3 HIGH POTENCY PO) Take 5,000 mg by mouth daily.  Marland Kitchen co-enzyme Q-10 30 MG capsule Take 30 mg by mouth daily.  Marland Kitchen DHEA 10 MG CAPS Take by mouth daily.  Marland Kitchen omega-3 acid ethyl esters (LOVAZA) 1 g capsule Take 1 g by mouth daily.  . Probiotic Product (PROBIOTIC-10 PO) Take by mouth.   No facility-administered encounter medications on file as of 09/28/2019.     ROS: .Pertinent positives and negatives noted in HPI. Remainder of ROS non-contributory    No Known Allergies  BP 128/80 (BP Location: Left Arm, Patient Position: Sitting, Cuff Size: Normal)   Pulse 68   Temp (!) 97.4 F (36.3 C) (Temporal)   Ht 5' 7.75" (1.721 m)   Wt 187 lb 6.4 oz (85 kg)   SpO2 97%   BMI 28.70 kg/m    Wt Readings from Last 3 Encounters:  09/28/19 187 lb 6.4 oz (85 kg)  08/08/19 191 lb 6 oz (86.8 kg)  07/18/19 172 lb (78 kg)     Physical Exam  Constitutional: He is oriented to person, place, and time. He appears well-developed and well-nourished. No distress.  HENT:  Head: Normocephalic and atraumatic.  Right Ear: Tympanic membrane and ear canal normal.  Left Ear: Tympanic membrane and ear canal normal.  Nose: Nose normal.  Mouth/Throat: Oropharynx is clear and moist and mucous membranes are normal.  Neck: No thyromegaly present.  Cardiovascular: Normal rate, regular rhythm, normal heart sounds and intact distal pulses.  Pulmonary/Chest: Effort normal and breath sounds normal. He has no wheezes. He has no rhonchi.  Abdominal: Soft. Bowel sounds are normal. He exhibits no distension and no mass. There is no abdominal tenderness. There is no rebound and no guarding.  Musculoskeletal:        General: No edema. Normal range of motion.     Cervical back: Neck supple.   Lymphadenopathy:    He has no cervical adenopathy.  Neurological: He is alert and oriented to person, place, and time. He exhibits normal muscle tone. Coordination normal.  Skin: Skin is warm and dry.  Psychiatric: He has a normal mood and affect.     A/P:  1. Annual physical exam - labs UTD - will be scanned into Epic - colonoscopy UTD - dental and vision UTD - discussed importance of regular  CV exercise, healthy diet, adequate sleep. Pt exercising 5 days/wk. - next CPE in 1 year  2. Iron deficiency - pt starting iron supplement  - recent internal hemorrhoid  - pt has labs done with NP and will send copy of results to me   This visit occurred during the SARS-CoV-2 public health emergency.  Safety protocols were in place, including screening questions prior to the visit, additional usage of staff PPE, and extensive cleaning of exam room while observing appropriate contact time as indicated for disinfecting solutions.

## 2019-10-04 ENCOUNTER — Encounter: Payer: Self-pay | Admitting: Family Medicine

## 2019-10-10 ENCOUNTER — Encounter: Payer: Self-pay | Admitting: Family Medicine

## 2019-10-10 DIAGNOSIS — N529 Male erectile dysfunction, unspecified: Secondary | ICD-10-CM

## 2019-10-10 NOTE — Telephone Encounter (Signed)
Please see message and advise.  Thank you. ° °

## 2019-10-12 MED ORDER — SILDENAFIL CITRATE 50 MG PO TABS: 50.0000 mg | ORAL_TABLET | Freq: Every day | ORAL | 0 refills | Status: DC | PRN

## 2019-10-12 MED FILL — SILDENAFIL CITRATE 50 MG TA: 50 | 30 days supply | Qty: 6 | Fill #0

## 2019-11-16 ENCOUNTER — Ambulatory Visit (INDEPENDENT_AMBULATORY_CARE_PROVIDER_SITE_OTHER): Payer: 59 | Admitting: Internal Medicine

## 2019-11-16 ENCOUNTER — Encounter: Payer: Self-pay | Admitting: Internal Medicine

## 2019-11-16 VITALS — BP 104/72 | HR 87 | Ht 66.5 in | Wt 189.2 lb

## 2019-11-16 DIAGNOSIS — K642 Third degree hemorrhoids: Secondary | ICD-10-CM | POA: Diagnosis not present

## 2019-11-16 NOTE — Assessment & Plan Note (Signed)
Banded RP again RTC prn Benefiber

## 2019-11-16 NOTE — Patient Instructions (Signed)
HEMORRHOID BANDING PROCEDURE    FOLLOW-UP CARE   1. The procedure you have had should have been relatively painless since the banding of the area involved does not have nerve endings and there is no pain sensation.  The rubber band cuts off the blood supply to the hemorrhoid and the band may fall off as soon as 48 hours after the banding (the band may occasionally be seen in the toilet bowl following a bowel movement). You may notice a temporary feeling of fullness in the rectum which should respond adequately to plain Tylenol or Motrin.  2. Following the banding, avoid strenuous exercise that evening and resume full activity the next day.  A sitz bath (soaking in a warm tub) or bidet is soothing, and can be useful for cleansing the area after bowel movements.     3. To avoid constipation, take two tablespoons of natural wheat bran, natural oat bran, flax, Benefiber or any over the counter fiber supplement and increase your water intake to 7-8 glasses daily.    4. Unless you have been prescribed anorectal medication, do not put anything inside your rectum for two weeks: No suppositories, enemas, fingers, etc.  5. Occasionally, you may have more bleeding than usual after the banding procedure.  This is often from the untreated hemorrhoids rather than the treated one.  Don't be concerned if there is a tablespoon or so of blood.  If there is more blood than this, lie flat with your bottom higher than your head and apply an ice pack to the area. If the bleeding does not stop within a half an hour or if you feel faint, call our office at [phone] or go to the emergency room.  6. Problems are not common; however, if there is a substantial amount of bleeding, severe pain, chills, fever or difficulty passing urine (very rare) or other problems, you should call us at [phone] or report to the nearest emergency room.  7. Do not stay seated continuously for more than 2-3 hours for a day or two  after the procedure.  Tighten your buttock muscles 10-15 times every two hours and take 10-15 deep breaths every 1-2 hours.  Do not spend more than a few minutes on the toilet if you cannot empty your bowel; instead re-visit the toilet at a later time.    Continue your Benefiber.   See how you do and contact us if you feel you need to be seen again.   I appreciate the opportunity to care for you. Stan Head, MD, [facility]

## 2019-11-16 NOTE — Progress Notes (Signed)
   S/P banding of Gr 3 RP internal hemorrhoid 2/21 - then no bleeding so no more banding but had recurrent bleeding so returns. Denies prolapse sxs.   Rectal - small tags/external hemorrhoids, palpable fleshy RP internal hemorrhoid, no mass, non-tender  Anoscopy was performed with the patient in the left lateral decubitus position while a chaperone was present and revealed Gr 2 RP >> Gr 2 RA and LL internal hemorrhoids   PROCEDURE NOTE: The patient presents with symptomatic grade 2 hemorrhoids, requesting rubber band ligation of his/her hemorrhoidal disease.  All risks, benefits and alternative forms of therapy were described and informed consent was obtained.   The anorectum was pre-medicated with 0.125% NTg and 5% lidocaine The decision was made to band the RP internal hemorrhoid, and the [facility] O'Regan System was used to perform band ligation without complication.  Digital anorectal examination was then performed to assure proper positioning of the band, and to adjust the banded tissue as required.  The patient was discharged home without pain or other issues.  Dietary and behavioral recommendations were given and along with follow-up instructions.     The following adjunctive treatments were recommended:  Continue Benefiber  The patient will return prn for  follow-up and possible additional banding as required. No complications were encountered and the patient tolerated the procedure well.  [id number], Jearld Lesch, DO

## 2019-12-05 DIAGNOSIS — H524 Presbyopia: Secondary | ICD-10-CM | POA: Diagnosis not present

## 2019-12-05 DIAGNOSIS — E291 Testicular hypofunction: Secondary | ICD-10-CM | POA: Diagnosis not present

## 2019-12-05 DIAGNOSIS — R790 Abnormal level of blood mineral: Secondary | ICD-10-CM | POA: Diagnosis not present

## 2019-12-05 DIAGNOSIS — H5213 Myopia, bilateral: Secondary | ICD-10-CM | POA: Diagnosis not present

## 2019-12-16 ENCOUNTER — Encounter: Payer: Self-pay | Admitting: Family Medicine

## 2019-12-16 DIAGNOSIS — E291 Testicular hypofunction: Secondary | ICD-10-CM | POA: Diagnosis not present

## 2019-12-16 DIAGNOSIS — R6882 Decreased libido: Secondary | ICD-10-CM | POA: Diagnosis not present

## 2019-12-16 DIAGNOSIS — R5383 Other fatigue: Secondary | ICD-10-CM | POA: Diagnosis not present

## 2019-12-16 DIAGNOSIS — E785 Hyperlipidemia, unspecified: Secondary | ICD-10-CM

## 2019-12-21 ENCOUNTER — Encounter: Payer: Self-pay | Admitting: Family Medicine

## 2019-12-29 ENCOUNTER — Encounter: Payer: Self-pay | Admitting: Family Medicine

## 2019-12-30 ENCOUNTER — Telehealth: Payer: Self-pay | Admitting: Family Medicine

## 2019-12-30 NOTE — Telephone Encounter (Signed)
Patient called back and stated that orders for the lipid panel needed to be faxed over to the [facility] at [phone] Attention Raiann, please advise. CB is [phone]

## 2019-12-30 NOTE — Telephone Encounter (Signed)
.  bt 

## 2019-12-30 NOTE — Telephone Encounter (Signed)
Patient is calling and stated that he got blood work done today at [facility] and they needed for Dr. C to fax over an order to [phone] attention Raiann. CB is [phone]

## 2019-12-30 NOTE — Telephone Encounter (Signed)
Lab orders previously placed for lipid panel and CMP w/ resulting agency as [facility]. How do these get released or sent to [facility]?

## 2019-12-30 NOTE — Addendum Note (Signed)
Addended by: Varney Biles on: 12/30/2019 02:52 PM   Modules accepted: Orders

## 2019-12-30 NOTE — Addendum Note (Signed)
Addended by: Varney Biles on: 12/30/2019 02:50 PM   Modules accepted: Orders

## 2019-12-30 NOTE — Telephone Encounter (Signed)
Lance Wright has electronically sent information.

## 2020-01-04 ENCOUNTER — Encounter: Payer: Self-pay | Admitting: Family Medicine

## 2020-01-04 NOTE — Addendum Note (Signed)
Addended by: Varney Biles on: 01/04/2020 03:09 PM   Modules accepted: Orders

## 2020-02-06 MED FILL — SILDENAFIL CITRATE 50 MG TA: 50 | 20 days supply | Qty: 4 | Fill #1

## 2020-03-20 ENCOUNTER — Encounter: Payer: Self-pay | Admitting: Family Medicine

## 2020-04-25 DIAGNOSIS — R5383 Other fatigue: Secondary | ICD-10-CM | POA: Diagnosis not present

## 2020-04-25 DIAGNOSIS — E611 Iron deficiency: Secondary | ICD-10-CM | POA: Diagnosis not present

## 2020-04-25 DIAGNOSIS — G4709 Other insomnia: Secondary | ICD-10-CM | POA: Diagnosis not present

## 2020-04-25 DIAGNOSIS — Z0000 Encounter for general adult medical examination without abnormal findings: Secondary | ICD-10-CM | POA: Diagnosis not present

## 2020-04-25 DIAGNOSIS — R6882 Decreased libido: Secondary | ICD-10-CM | POA: Diagnosis not present

## 2020-04-25 DIAGNOSIS — E785 Hyperlipidemia, unspecified: Secondary | ICD-10-CM | POA: Diagnosis not present

## 2020-04-25 DIAGNOSIS — M2550 Pain in unspecified joint: Secondary | ICD-10-CM | POA: Diagnosis not present

## 2020-04-26 ENCOUNTER — Encounter: Payer: Self-pay | Admitting: Family Medicine

## 2020-04-26 LAB — COMPREHENSIVE METABOLIC PANEL
AG Ratio: 1.8 (calc) (ref 1.0–2.5)
ALT: 35 U/L (ref 9–46)
AST: 20 U/L (ref 10–35)
Albumin: 4.7 g/dL (ref 3.6–5.1)
Alkaline phosphatase (APISO): 112 U/L (ref 35–144)
BUN/Creatinine Ratio: 30 (calc) — ABNORMAL HIGH (ref 6–22)
BUN: 26 mg/dL — ABNORMAL HIGH (ref 7–25)
CO2: 28 mmol/L (ref 20–32)
Calcium: 9.4 mg/dL (ref 8.6–10.3)
Chloride: 105 mmol/L (ref 98–110)
Creat: 0.87 mg/dL (ref 0.70–1.33)
Globulin: 2.6 g/dL (calc) (ref 1.9–3.7)
Glucose, Bld: 89 mg/dL (ref 65–99)
Potassium: 4.6 mmol/L (ref 3.5–5.3)
Sodium: 140 mmol/L (ref 135–146)
Total Bilirubin: 1.4 mg/dL — ABNORMAL HIGH (ref 0.2–1.2)
Total Protein: 7.3 g/dL (ref 6.1–8.1)

## 2020-04-26 LAB — LIPID PANEL
Cholesterol: 114 mg/dL (ref <200)
HDL: 36 mg/dL — ABNORMAL LOW (ref >=40)
LDL Cholesterol (Calc): 57 mg/dL (calc)
Non-HDL Cholesterol (Calc): 78 mg/dL (calc) (ref <130)
Total CHOL/HDL Ratio: 3.2 (calc) (ref <5.0)
Triglycerides: 124 mg/dL (ref <150)

## 2020-05-07 ENCOUNTER — Other Ambulatory Visit: Payer: Self-pay | Admitting: Family Medicine

## 2020-05-07 ENCOUNTER — Other Ambulatory Visit (HOSPITAL_COMMUNITY): Payer: Self-pay | Admitting: Family Medicine

## 2020-05-07 DIAGNOSIS — N529 Male erectile dysfunction, unspecified: Secondary | ICD-10-CM

## 2020-05-07 MED FILL — SILDENAFIL CITRATE 50 MG TA: 50 | 30 days supply | Qty: 4 | Fill #0

## 2020-05-07 NOTE — Telephone Encounter (Signed)
Last OV 09/28/19 Last fil 10/12/19  #10/0

## 2020-05-09 ENCOUNTER — Telehealth: Payer: Self-pay | Admitting: Nurse Practitioner

## 2020-05-09 DIAGNOSIS — N529 Male erectile dysfunction, unspecified: Secondary | ICD-10-CM

## 2020-05-09 MED ORDER — SILDENAFIL CITRATE 50 MG PO TABS: ORAL_TABLET | ORAL | 0 refills | Status: AC

## 2020-05-09 NOTE — Telephone Encounter (Signed)
Refill sent.

## 2020-07-24 ENCOUNTER — Other Ambulatory Visit (HOSPITAL_COMMUNITY): Payer: Self-pay | Admitting: Physician Assistant

## 2020-07-24 DIAGNOSIS — B360 Pityriasis versicolor: Secondary | ICD-10-CM | POA: Diagnosis not present

## 2020-07-24 MED FILL — KETOCONAZOLE 2 % CREA: 2 | 30 days supply | Qty: 60 | Fill #0

## 2020-08-20 ENCOUNTER — Encounter: Payer: Self-pay | Admitting: Family Medicine

## 2020-08-20 DIAGNOSIS — H9193 Unspecified hearing loss, bilateral: Secondary | ICD-10-CM

## 2020-09-05 ENCOUNTER — Other Ambulatory Visit (HOSPITAL_BASED_OUTPATIENT_CLINIC_OR_DEPARTMENT_OTHER): Payer: Self-pay

## 2020-10-01 DIAGNOSIS — R5383 Other fatigue: Secondary | ICD-10-CM | POA: Diagnosis not present

## 2020-10-01 DIAGNOSIS — R6882 Decreased libido: Secondary | ICD-10-CM | POA: Diagnosis not present

## 2020-10-01 DIAGNOSIS — M2550 Pain in unspecified joint: Secondary | ICD-10-CM | POA: Diagnosis not present

## 2020-10-01 DIAGNOSIS — G4709 Other insomnia: Secondary | ICD-10-CM | POA: Diagnosis not present

## 2020-10-12 ENCOUNTER — Other Ambulatory Visit (HOSPITAL_COMMUNITY): Payer: Self-pay

## 2020-10-31 ENCOUNTER — Encounter: Payer: Self-pay | Admitting: Family Medicine

## 2020-10-31 NOTE — Telephone Encounter (Signed)
Please see message and advise.  Thank you. ° °

## 2020-11-13 ENCOUNTER — Other Ambulatory Visit (HOSPITAL_COMMUNITY): Payer: Self-pay

## 2020-11-29 ENCOUNTER — Other Ambulatory Visit (HOSPITAL_COMMUNITY): Payer: Self-pay

## 2020-11-29 MED FILL — Ketoconazole Cream 2%: CUTANEOUS | 30 days supply | Qty: 60 | Fill #0 | Status: AC

## 2021-03-08 DIAGNOSIS — M2550 Pain in unspecified joint: Secondary | ICD-10-CM | POA: Diagnosis not present

## 2021-09-11 DIAGNOSIS — R6882 Decreased libido: Secondary | ICD-10-CM | POA: Diagnosis not present

## 2021-09-11 DIAGNOSIS — G4709 Other insomnia: Secondary | ICD-10-CM | POA: Diagnosis not present

## 2021-09-11 DIAGNOSIS — R5383 Other fatigue: Secondary | ICD-10-CM | POA: Diagnosis not present

## 2021-09-11 DIAGNOSIS — M2550 Pain in unspecified joint: Secondary | ICD-10-CM | POA: Diagnosis not present

## 2021-09-11 LAB — PSA: PSA: 0.8

## 2021-09-11 LAB — COMPREHENSIVE METABOLIC PANEL
Albumin: 4.9 (ref 3.5–5.0)
Calcium: 9.3 (ref 8.7–10.7)
eGFR: 104

## 2021-09-11 LAB — HEPATIC FUNCTION PANEL
ALT: 40 U/L (ref 10–40)
AST: 21 (ref 14–40)
Alkaline Phosphatase: 81 (ref 25–125)

## 2021-09-11 LAB — BASIC METABOLIC PANEL
BUN: 23 — AB (ref 4–21)
CO2: 29 — AB (ref 13–22)
Chloride: 103 (ref 99–108)
Creatinine: 0.8 (ref 0.6–1.3)
Glucose: 99
Potassium: 4.7 mEq/L (ref 3.5–5.1)
Sodium: 140 (ref 137–147)

## 2021-09-11 LAB — CBC AND DIFFERENTIAL
HCT: 51 (ref 41–53)
Hemoglobin: 16 (ref 13.5–17.5)
Platelets: 270 10*3/uL (ref 150–400)
WBC: 7.8

## 2021-09-11 LAB — IRON,TIBC AND FERRITIN PANEL: Ferritin: 108

## 2021-09-11 LAB — VITAMIN D 25 HYDROXY (VIT D DEFICIENCY, FRACTURES): Vit D, 25-Hydroxy: 56

## 2021-09-11 LAB — CBC: RBC: 7.96 — AB (ref 3.87–5.11)

## 2021-09-23 ENCOUNTER — Encounter: Payer: Self-pay | Admitting: Nurse Practitioner

## 2021-09-23 ENCOUNTER — Ambulatory Visit: Payer: 59 | Admitting: Nurse Practitioner

## 2021-09-23 VITALS — BP 126/94 | HR 72 | Temp 97.5°F | Ht 66.5 in | Wt 193.4 lb

## 2021-09-23 DIAGNOSIS — Z23 Encounter for immunization: Secondary | ICD-10-CM | POA: Diagnosis not present

## 2021-09-23 DIAGNOSIS — Z0000 Encounter for general adult medical examination without abnormal findings: Secondary | ICD-10-CM | POA: Diagnosis not present

## 2021-09-23 DIAGNOSIS — E785 Hyperlipidemia, unspecified: Secondary | ICD-10-CM

## 2021-09-23 DIAGNOSIS — R7989 Other specified abnormal findings of blood chemistry: Secondary | ICD-10-CM | POA: Insufficient documentation

## 2021-09-23 NOTE — Assessment & Plan Note (Addendum)
History of elevated cholesterol that he is managing with diet and exercise.  He had labs done recently that have not resulted yet at [facility], however will send the results when they are ready.  Continue co-Q10, omega-3 daily ?

## 2021-09-23 NOTE — Progress Notes (Signed)
? ?BP (!) 126/94 (BP Location: Left Arm, Patient Position: Sitting, Cuff Size: Normal)   Pulse 72   Temp (!) 97.5 ?F (36.4 ?C) (Temporal)   Ht 5' 6.5" (1.689 m)   Wt 193 lb 6.4 oz (87.7 kg)   SpO2 98%   BMI 30.75 kg/m?   ? ?Subjective:  ? ? Patient ID: Lance Wright, male    DOB: [birth year], 56 y.o.   MRN: [id number] ? ?CC: ?Chief Complaint  ?Patient presents with  ? Establish Care  ?  Np. Est care. No main concerns. Requesting CPE  ? ?HPI: ?Lance Wright is a 56 y.o. male presenting on 09/23/2021 to transfer care to a new provider and for comprehensive medical examination. Current medical complaints include:none ? ?He currently lives with: fiance, mother ?Interim Problems from his last visit: no ? ?Depression Screen done today and results listed below:  ? ?  09/23/2021  ?  2:17 PM  ?Depression screen PHQ 2/9  ?Decreased Interest 0  ?Down, Depressed, Hopeless 0  ?PHQ - 2 Score 0  ?Altered sleeping 1  ?Tired, decreased energy 0  ?Change in appetite 0  ?Feeling bad or failure about yourself  0  ?Trouble concentrating 0  ?Moving slowly or fidgety/restless 0  ?Suicidal thoughts 0  ?PHQ-9 Score 1  ?Difficult doing work/chores Not difficult at all  ? ? ?The patient does not have a history of falls. I did not complete a risk assessment for falls. A plan of care for falls was not documented. ? ? ?Past Medical History:  ?Past Medical History:  ?Diagnosis Date  ? ADHD (attention deficit hyperactivity disorder)   ? Bleeding internal hemorrhoids 07/18/2019  ? ED (erectile dysfunction)   ? Elevated LFTs   ? mild, chronic per pt. Reportedly normal w/u (no records available). 09/2016 SGPT/ALT 52, T bili 1.9  ? Gallstones   ? Hyperlipidemia   ? low HDL, elevated TG  ? Iron deficiency 07/18/2019  ? Ferritin 17  ? Testosterone deficiency   ? Thalassemia   ? ? ?Surgical History:  ?Past Surgical History:  ?Procedure Laterality Date  ? CHOLECYSTECTOMY, LAPAROSCOPIC  2016  ? COLONOSCOPY  2012  ? hemorrhoids  ? FUNCTIONAL ENDOSCOPIC  SINUS SURGERY Right 06/30/2014  ? HEMORRHOID BANDING    ? HIP ARTHROSCOPY Left 2009-2010  ? 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 SINUS EXPLORATION    ? 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 VASECTOMY    ? ? ?Medications:  ?Current Outpatient Medications on File Prior to Visit  ?Medication Sig  ? Barberry-Oreg Grape-Goldenseal (BERBERINE COMPLEX PO) Take by mouth daily.  ? Cholecalciferol (VITAMIN D3 HIGH POTENCY PO) Take 5,000 mg by mouth daily.  ? co-enzyme Q-10 30 MG capsule Take 30 mg by mouth daily.  ? DHEA 10 MG CAPS Take by mouth daily.  ? omega-3 acid ethyl esters (LOVAZA) 1 g capsule Take 1 g by mouth daily.  ? Probiotic Product (PROBIOTIC-10 PO) Take by mouth.  ? sildenafil (VIAGRA) 50 MG tablet TAKE 1 TABLET BY MOUTH ONCE DAILY AS NEEDED FOR ERECTILE DYSFUNCTION  ? ?No current facility-administered medications on file prior to visit.  ? ? ?Allergies:  ?No Known Allergies ? ?Social History:  ?Social History  ? ?Socioeconomic History  ? Marital status: Divorced  ?  Spouse name: Not on file  ? Number of children: Not on file  ? Years of education: Not on file  ? Highest education level: Not on  file  ?Occupational History  ? Not on file  ?Tobacco Use  ? Smoking status: Never  ? Smokeless tobacco: Never  ?Vaping Use  ? Vaping Use: Never used  ?Substance and Sexual Activity  ? Alcohol use: No  ? Drug use: No  ? Sexual activity: Yes  ?  Partners: Female  ?  Birth control/protection: Surgical  ?Other Topics Concern  ? Not on file  ?Social History Narrative  ? Not on file  ? ?Social Determinants of Health  ? ?Financial Resource Strain: Not on file  ?Food Insecurity: Not on file  ?Transportation Needs: Not on file  ?Physical Activity: Not on file  ?Stress: Not on file  ?Social Connections: Not on file  ?Intimate Partner Violence: Not on file  ? ?Social History  ? ?Tobacco Use  ?Smoking Status Never  ?Smokeless Tobacco Never  ? ?Social History  ? ?Substance and Sexual Activity  ?Alcohol Use No   ? ? ?Family History:  ?Family History  ?Problem Relation Age of Onset  ? Osteoporosis Mother   ? Cancer Father   ?     non-hodgkin lymphoma  ? COPD Father   ? Hypertension Father   ? Obesity Father   ? Allergies Brother   ? Diabetes type I Son   ? CAD Paternal Grandfather   ? Diverticulitis Paternal Grandfather   ? Colon cancer Neg Hx   ? Esophageal cancer Neg Hx   ? Liver cancer Neg Hx   ? Stomach cancer Neg Hx   ? ? ?Past medical history, surgical history, medications, allergies, family history and social history reviewed with patient today and changes made to appropriate areas of the chart.  ? ?Review of Systems  ?Constitutional: Negative.   ?HENT: Negative.    ?Eyes: Negative.   ?Respiratory: Negative.    ?Cardiovascular: Negative.   ?Gastrointestinal: Negative.   ?Genitourinary: Negative.   ?Musculoskeletal: Negative.   ?Skin: Negative.   ?Neurological: Negative.   ?Psychiatric/Behavioral: Negative.    ? ?   ?Objective:  ?  ?BP (!) 126/94 (BP Location: Left Arm, Patient Position: Sitting, Cuff Size: Normal)   Pulse 72   Temp (!) 97.5 ?F (36.4 ?C) (Temporal)   Ht 5' 6.5" (1.689 m)   Wt 193 lb 6.4 oz (87.7 kg)   SpO2 98%   BMI 30.75 kg/m?   ?Wt Readings from Last 3 Encounters:  ?09/23/21 193 lb 6.4 oz (87.7 kg)  ?11/16/19 189 lb 4 oz (85.8 kg)  ?09/28/19 187 lb 6.4 oz (85 kg)  ?  ?Physical Exam ?Vitals and nursing note reviewed.  ?Constitutional:   ?   General: He is not in acute distress. ?   Appearance: Normal appearance.  ?HENT:  ?   Head: Normocephalic and atraumatic.  ?   Right Ear: Tympanic membrane, ear canal and external ear normal.  ?   Left Ear: Tympanic membrane, ear canal and external ear normal.  ?Eyes:  ?   Conjunctiva/sclera: Conjunctivae normal.  ?Cardiovascular:  ?   Rate and Rhythm: Normal rate and regular rhythm.  ?   Pulses: Normal pulses.  ?   Heart sounds: Normal heart sounds.  ?Pulmonary:  ?   Effort: Pulmonary effort is normal.  ?   Breath sounds: Normal breath sounds.  ?Abdominal:   ?   Palpations: Abdomen is soft.  ?   Tenderness: There is no abdominal tenderness.  ?Musculoskeletal:     ?   General: Normal range of motion.  ?   Cervical back:  Normal range of motion and neck supple. No tenderness.  ?   Right lower leg: No edema.  ?   Left lower leg: No edema.  ?   Comments: Strength 5/5 in bilateral upper and lower extremities  ?Lymphadenopathy:  ?   Cervical: No cervical adenopathy.  ?Skin: ?   General: Skin is warm and dry.  ?Neurological:  ?   General: No focal deficit present.  ?   Mental Status: He is alert and oriented to person, place, and time.  ?   Motor: No weakness.  ?   Coordination: Coordination normal.  ?   Gait: Gait normal.  ?Psychiatric:     ?   Mood and Affect: Mood normal.     ?   Behavior: Behavior normal.     ?   Thought Content: Thought content normal.     ?   Judgment: Judgment normal.  ? ? ?Results for orders placed or performed in visit on 09/29/19  ?Iron, TIBC and Ferritin Panel  ?Result Value Ref Range  ? Ferritin 24   ?CBC and differential  ?Result Value Ref Range  ? Hemoglobin 15.8 13.5 - 17.5  ? HCT 52 41 - 53  ? Platelets 278 150 - 399  ? WBC 7.9   ?CBC  ?Result Value Ref Range  ? RBC 7.87 (A) 3.87 - 5.11  ?TSH  ?Result Value Ref Range  ? TSH 1.01 0.41 - 5.90  ? ?   ?Assessment & Plan:  ? ?Problem List Items Addressed This Visit   ? ?  ? Other  ? Dyslipidemia  ?  History of elevated cholesterol that he is managing with diet and exercise.  He had labs done recently that have not resulted yet at [facility], however will send the results when they are ready.  Continue co-Q10, omega-3 daily ?  ?  ? ?Other Visit Diagnoses   ? ? Routine general medical examination at a health care facility    -  Primary  ? Health maintenance reviewed and updated.  He exercises routinely and does CrossFit.  We will update Td booster today.  Follow-up 1 year  ? Need for Td vaccine      ? TD booster updated today  ? Relevant Orders  ? Td vaccine greater than or equal to 7yo preservative free  IM (Completed)  ? ?  ?  ? ?Discussed aspirin prophylaxis for myocardial infarction prevention and decision was it was not indicated ? ?LABORATORY TESTING:  ?Health maintenance labs ordered today as discussed above

## 2021-09-23 NOTE — Patient Instructions (Signed)
It was great to see you! ? ?Keep up the great work!! Send a copy of your labs through [facility] or we can make a copy if you bring them after you get the results.  ? ?Let's follow-up in 1 year, sooner if you have concerns. ? ?If a referral was placed today, you will be contacted for an appointment. Please note that routine referrals can sometimes take up to 3-4 weeks to process. Please call our office if you haven't heard anything after this time frame. ? ?Take care, ? ?Rodman Pickle, NP ? ?

## 2021-11-04 ENCOUNTER — Encounter: Payer: Self-pay | Admitting: Nurse Practitioner

## 2021-11-14 DIAGNOSIS — H5213 Myopia, bilateral: Secondary | ICD-10-CM | POA: Diagnosis not present

## 2021-11-14 DIAGNOSIS — H524 Presbyopia: Secondary | ICD-10-CM | POA: Diagnosis not present

## 2022-03-06 DIAGNOSIS — E559 Vitamin D deficiency, unspecified: Secondary | ICD-10-CM | POA: Diagnosis not present

## 2022-03-06 DIAGNOSIS — R6882 Decreased libido: Secondary | ICD-10-CM | POA: Diagnosis not present

## 2022-03-06 DIAGNOSIS — E291 Testicular hypofunction: Secondary | ICD-10-CM | POA: Diagnosis not present

## 2022-03-06 LAB — HEPATIC FUNCTION PANEL
ALT: 59 U/L — AB (ref 10–40)
AST: 29 (ref 14–40)
Alkaline Phosphatase: 77 (ref 25–125)

## 2022-03-06 LAB — VITAMIN D 25 HYDROXY (VIT D DEFICIENCY, FRACTURES): Vit D, 25-Hydroxy: 66

## 2022-03-06 LAB — BASIC METABOLIC PANEL
BUN: 27 — AB (ref 4–21)
CO2: 30 — AB (ref 13–22)
Chloride: 101 (ref 99–108)
Creatinine: 0.9 (ref 0.6–1.3)
Glucose: 95
Potassium: 4.8 mEq/L (ref 3.5–5.1)
Sodium: 138 (ref 137–147)

## 2022-03-06 LAB — IRON,TIBC AND FERRITIN PANEL: Ferritin: 167

## 2022-03-06 LAB — CBC AND DIFFERENTIAL
HCT: 52 (ref 41–53)
Hemoglobin: 15.9 (ref 13.5–17.5)
Platelets: 267 10*3/uL (ref 150–400)
WBC: 7.4

## 2022-03-06 LAB — TESTOSTERONE: Testosterone: 474

## 2022-03-06 LAB — PSA: PSA: 0.7

## 2022-03-10 LAB — COMPREHENSIVE METABOLIC PANEL
Albumin: 4.9 (ref 3.5–5.0)
Calcium: 9.6 (ref 8.7–10.7)
eGFR: 103

## 2022-03-12 ENCOUNTER — Encounter: Payer: Self-pay | Admitting: Nurse Practitioner

## 2022-09-02 ENCOUNTER — Other Ambulatory Visit (HOSPITAL_COMMUNITY): Payer: Self-pay

## 2022-09-05 DIAGNOSIS — R6882 Decreased libido: Secondary | ICD-10-CM | POA: Diagnosis not present

## 2022-09-05 DIAGNOSIS — E559 Vitamin D deficiency, unspecified: Secondary | ICD-10-CM | POA: Diagnosis not present

## 2022-09-05 DIAGNOSIS — R5383 Other fatigue: Secondary | ICD-10-CM | POA: Diagnosis not present

## 2022-09-05 DIAGNOSIS — E291 Testicular hypofunction: Secondary | ICD-10-CM | POA: Diagnosis not present

## 2022-09-05 LAB — VITAMIN D 25 HYDROXY (VIT D DEFICIENCY, FRACTURES): Vit D, 25-Hydroxy: 49

## 2022-09-05 LAB — CBC AND DIFFERENTIAL
HCT: 50 (ref 41–53)
Hemoglobin: 15.8 (ref 13.5–17.5)
Platelets: 281 10*3/uL (ref 150–400)
WBC: 8.9

## 2022-09-05 LAB — BASIC METABOLIC PANEL
BUN: 26 — AB (ref 4–21)
CO2: 28 — AB (ref 13–22)
Chloride: 99 (ref 99–108)
Creatinine: 0.8 (ref 0.6–1.3)
Glucose: 64
Potassium: 4.7 mEq/L (ref 3.5–5.1)
Sodium: 138 (ref 137–147)

## 2022-09-05 LAB — COMPREHENSIVE METABOLIC PANEL
Albumin: 4.6 (ref 3.5–5.0)
Calcium: 9.4 (ref 8.7–10.7)
Globulin: 3
eGFR: 105

## 2022-09-05 LAB — HEPATIC FUNCTION PANEL
ALT: 57 U/L — AB (ref 10–40)
AST: 25 (ref 14–40)
Bilirubin, Total: 1.1

## 2022-09-05 LAB — TESTOSTERONE: Testosterone: 590

## 2022-09-05 LAB — PSA: PSA: 0.9

## 2022-09-05 LAB — CBC: RBC: 7.82 — AB (ref 3.87–5.11)

## 2022-09-11 DIAGNOSIS — R6882 Decreased libido: Secondary | ICD-10-CM | POA: Diagnosis not present

## 2022-09-11 DIAGNOSIS — G4709 Other insomnia: Secondary | ICD-10-CM | POA: Diagnosis not present

## 2022-09-11 DIAGNOSIS — R5383 Other fatigue: Secondary | ICD-10-CM | POA: Diagnosis not present

## 2022-09-11 DIAGNOSIS — M2550 Pain in unspecified joint: Secondary | ICD-10-CM | POA: Diagnosis not present

## 2022-10-09 ENCOUNTER — Encounter: Payer: 59 | Admitting: Nurse Practitioner

## 2022-10-09 ENCOUNTER — Telehealth: Payer: Self-pay | Admitting: Nurse Practitioner

## 2022-10-09 NOTE — Telephone Encounter (Signed)
Duplicate note 1st no show, fee waived, letter & text sent

## 2022-10-09 NOTE — Telephone Encounter (Signed)
1st no show, fee waived, letter & text sent

## 2022-10-09 NOTE — Telephone Encounter (Signed)
Noted  

## 2022-10-09 NOTE — Telephone Encounter (Signed)
4.25.24 no show letter sent 

## 2022-10-28 ENCOUNTER — Encounter: Payer: Self-pay | Admitting: Nurse Practitioner

## 2022-11-25 DIAGNOSIS — H5213 Myopia, bilateral: Secondary | ICD-10-CM | POA: Diagnosis not present

## 2023-01-08 ENCOUNTER — Ambulatory Visit (INDEPENDENT_AMBULATORY_CARE_PROVIDER_SITE_OTHER): Payer: 59 | Admitting: Nurse Practitioner

## 2023-01-08 ENCOUNTER — Encounter: Payer: Self-pay | Admitting: Nurse Practitioner

## 2023-01-08 VITALS — BP 122/80 | HR 77 | Temp 98.5°F | Ht 66.5 in | Wt 198.0 lb

## 2023-01-08 DIAGNOSIS — Z0000 Encounter for general adult medical examination without abnormal findings: Secondary | ICD-10-CM | POA: Diagnosis not present

## 2023-01-08 DIAGNOSIS — M25561 Pain in right knee: Secondary | ICD-10-CM | POA: Insufficient documentation

## 2023-01-08 NOTE — Progress Notes (Signed)
BP 122/80 (BP Location: Left Arm)   Pulse 77   Temp 98.5 F (36.9 C) (Oral)   Ht 5' 6.5" (1.689 m)   Wt 198 lb (89.8 kg)   SpO2 97%   BMI 31.48 kg/m    Subjective:    Patient ID: Lance Wright, male    DOB: 03-Dec-1965, 57 y.o.   MRN: [id number]  CC: Chief Complaint  Patient presents with   Annual Exam    No concerns, patient has a copy of recent labs done in 08/2022    HPI: Lance Wright is a 57 y.o. male presenting on 01/08/2023 for comprehensive medical examination. Current medical complaints include:none  He currently lives with: Wife  Depression and Anxiety Screen done today and results listed below:     01/08/2023    2:52 PM 09/23/2021    2:17 PM  Depression screen PHQ 2/9  Decreased Interest 0 0  Down, Depressed, Hopeless 0 0  PHQ - 2 Score 0 0  Altered sleeping 1 1  Tired, decreased energy  0  Change in appetite 0 0  Feeling bad or failure about yourself  0 0  Trouble concentrating 0 0  Moving slowly or fidgety/restless 0 0  Suicidal thoughts 0 0  PHQ-9 Score 1 1  Difficult doing work/chores  Not difficult at all      01/08/2023    2:53 PM 09/23/2021    2:18 PM  GAD 7 : Generalized Anxiety Score  Nervous, Anxious, on Edge 0 0  Control/stop worrying 0 0  Worry too much - different things 0 0  Trouble relaxing 0 0  Restless 1 0  Easily annoyed or irritable 0 0  Afraid - awful might happen 0 0  Total GAD 7 Score 1 0    The patient does not have a history of falls. I did not complete a risk assessment for falls. A plan of care for falls was not documented.   Past Medical History:  Past Medical History:  Diagnosis Date   ADHD (attention deficit hyperactivity disorder)    Bleeding internal hemorrhoids 07/18/2019   ED (erectile dysfunction)    Elevated LFTs    mild, chronic per pt. Reportedly normal w/u (no records available). 09/2016 SGPT/ALT 52, T bili 1.9   Gallstones    Hyperlipidemia    low HDL, elevated TG   Iron deficiency 07/18/2019    Ferritin 17   Testosterone deficiency    Thalassemia     Surgical History:  Past Surgical History:  Procedure Laterality Date   CHOLECYSTECTOMY, LAPAROSCOPIC  2016   COLONOSCOPY  2012   hemorrhoids   FUNCTIONAL ENDOSCOPIC SINUS SURGERY Right 06/30/2014   HEMORRHOID BANDING     HIP ARTHROSCOPY Left 2009-2010   REPAIR DURAL / CSF LEAK  06/30/2014   SINUS EXPLORATION     one surgery was complicated by CSF leak, requiring patch   VASECTOMY      Medications:  Current Outpatient Medications on File Prior to Visit  Medication Sig   Barberry-Oreg Grape-Goldenseal (BERBERINE COMPLEX PO) Take by mouth daily.   Cholecalciferol (VITAMIN D3 HIGH POTENCY PO) Take 5,000 mg by mouth daily.   co-enzyme Q-10 30 MG capsule Take 30 mg by mouth daily.   DHEA 10 MG CAPS Take by mouth daily.   omega-3 acid ethyl esters (LOVAZA) 1 g capsule Take 1 g by mouth daily.   Probiotic Product (PROBIOTIC-10 PO) Take by mouth.   saw palmetto 160 MG capsule Take 160  mg by mouth 2 (two) times daily.   sildenafil (VIAGRA) 50 MG tablet TAKE 1 TABLET BY MOUTH ONCE DAILY AS NEEDED FOR ERECTILE DYSFUNCTION   No current facility-administered medications on file prior to visit.    Allergies:  No Known Allergies  Social History:  Social History   Socioeconomic History   Marital status: Married    Spouse name: Not on file   Number of children: Not on file   Years of education: Not on file   Highest education level: Not on file  Occupational History   Not on file  Tobacco Use   Smoking status: Never   Smokeless tobacco: Never  Vaping Use   Vaping status: Never Used  Substance and Sexual Activity   Alcohol use: No   Drug use: No   Sexual activity: Yes    Partners: Female    Birth control/protection: Surgical  Other Topics Concern   Not on file  Social History Narrative   Not on file   Social Determinants of Health   Financial Resource Strain: Not on file  Food Insecurity: Not on file   Transportation Needs: Not on file  Physical Activity: Not on file  Stress: Not on file  Social Connections: Not on file  Intimate Partner Violence: Not on file   Social History   Tobacco Use  Smoking Status Never  Smokeless Tobacco Never   Social History   Substance and Sexual Activity  Alcohol Use No    Family History:  Family History  Problem Relation Age of Onset   Osteoporosis Mother    Cancer Father        non-hodgkin lymphoma   COPD Father    Hypertension Father    Obesity Father    Allergies Brother    Diabetes type I Son    CAD Paternal Grandfather    Diverticulitis Paternal Grandfather    Colon cancer Neg Hx    Esophageal cancer Neg Hx    Liver cancer Neg Hx    Stomach cancer Neg Hx     Past medical history, surgical history, medications, allergies, family history and social history reviewed with patient today and changes made to appropriate areas of the chart.   Review of Systems  Constitutional: Negative.   HENT: Negative.    Eyes: Negative.   Respiratory: Negative.    Cardiovascular: Negative.   Gastrointestinal: Negative.   Genitourinary: Negative.   Musculoskeletal:  Positive for joint pain.  Skin: Negative.   Neurological: Negative.   Endo/Heme/Allergies: Negative.   Psychiatric/Behavioral: Negative.         States having difficulty falling to sleep after getting up to use the bathroom in the middle of the night.   All other ROS negative except what is listed above and in the HPI.      Objective:    BP 122/80 (BP Location: Left Arm)   Pulse 77   Temp 98.5 F (36.9 C) (Oral)   Ht 5' 6.5" (1.689 m)   Wt 198 lb (89.8 kg)   SpO2 97%   BMI 31.48 kg/m   Wt Readings from Last 3 Encounters:  01/08/23 198 lb (89.8 kg)  09/23/21 193 lb 6.4 oz (87.7 kg)  11/16/19 189 lb 4 oz (85.8 kg)    Physical Exam Constitutional:      Appearance: Normal appearance. He is normal weight.  HENT:     Head: Normocephalic and atraumatic.     Right Ear:  Tympanic membrane, ear canal and external ear  normal.     Left Ear: Tympanic membrane, ear canal and external ear normal.     Nose: Nose normal. No congestion.     Mouth/Throat:     Mouth: Mucous membranes are moist.     Pharynx: No oropharyngeal exudate or posterior oropharyngeal erythema.  Eyes:     General:        Right eye: No discharge.        Left eye: No discharge.     Extraocular Movements: Extraocular movements intact.  Cardiovascular:     Rate and Rhythm: Normal rate and regular rhythm.     Pulses: Normal pulses.     Heart sounds: Normal heart sounds. No murmur heard.    No friction rub. No gallop.  Pulmonary:     Effort: Pulmonary effort is normal. No respiratory distress.     Breath sounds: Normal breath sounds. No wheezing.  Abdominal:     General: Bowel sounds are normal.     Palpations: Abdomen is soft. There is no mass.     Tenderness: There is no abdominal tenderness.  Musculoskeletal:        General: No swelling or tenderness. Normal range of motion.     Right lower leg: No edema.     Left lower leg: No edema.  Skin:    General: Skin is warm and dry.     Capillary Refill: Capillary refill takes 2 to 3 seconds.  Neurological:     Mental Status: He is alert and oriented to person, place, and time.     Motor: No weakness.     Deep Tendon Reflexes: Reflexes normal.  Psychiatric:        Mood and Affect: Mood normal.        Behavior: Behavior normal.        Thought Content: Thought content normal.        Judgment: Judgment normal.     Results for orders placed or performed in visit on 03/12/22  Iron, TIBC and Ferritin Panel  Result Value Ref Range   Ferritin 167   CBC and differential  Result Value Ref Range   Hemoglobin 15.9 13.5 - 17.5   HCT 52 41 - 53   Platelets 267 150 - 400 K/uL   WBC 7.4   VITAMIN D 25 Hydroxy (Vit-D Deficiency, Fractures)  Result Value Ref Range   Vit D, 25-Hydroxy 66   Basic metabolic panel  Result Value Ref Range    Glucose 95    BUN 27 (A) 4 - 21   CO2 30 (A) 13 - 22   Creatinine 0.9 0.6 - 1.3   Potassium 4.8 3.5 - 5.1 mEq/L   Sodium 138 137 - 147   Chloride 101 99 - 108  Comprehensive metabolic panel  Result Value Ref Range   eGFR 103    Calcium 9.6 8.7 - 10.7   Albumin 4.9 3.5 - 5.0  Hepatic function panel  Result Value Ref Range   Alkaline Phosphatase 77 25 - 125   ALT 59 (A) 10 - 40 U/L   AST 29 14 - 40  PSA  Result Value Ref Range   PSA 0.7   Testosterone  Result Value Ref Range   Testosterone 474       Assessment & Plan:   Problem List Items Addressed This Visit       Other   Routine general medical examination at a health care facility - Primary   Arthralgia of right knee  Regularly lift weights with exercise routine. Expresses occasional pain in right knee after workout. Encouraged to use voltaren gel for discomfort.       Routine general Medical Exam:  Health maintenance reviewed and updated. Discussed exercise and nutrition. Reviewed labs values from 08/2022.Encouraged to get lipid panel blood draw with next lab appointment. Return in once year for next annual exam.   LABORATORY TESTING:  Health maintenance labs ordered today as discussed above.    IMMUNIZATIONS:   - Tdap: Tetanus vaccination status reviewed: last tetanus booster within 10 years. - Influenza: Up to date - Pneumovax: Not applicable - Prevnar: Not applicable - HPV: Not applicable - Zostavax vaccine: Refused  SCREENING: - Colonoscopy: Up to date  Discussed with patient purpose of the colonoscopy is to detect colon cancer at curable precancerous or early stages   - AAA Screening: Not applicable   PATIENT COUNSELING:    Sexuality: Discussed sexually transmitted diseases, partner selection, use of condoms, avoidance of unintended pregnancy  and contraceptive alternatives.   Advised to avoid cigarette smoking.  I discussed with the patient that most people either abstain from alcohol or drink  within safe limits (<=14/week and <=4 drinks/occasion for males, <=7/weeks and <= 3 drinks/occasion for females) and that the risk for alcohol disorders and other health effects rises proportionally with the number of drinks per week and how often a drinker exceeds daily limits.  Discussed cessation/primary prevention of drug use and availability of treatment for abuse.   Diet: Encouraged to adjust caloric intake to maintain  or achieve ideal body weight, to reduce intake of dietary saturated fat and total fat, to limit sodium intake by avoiding high sodium foods and not adding table salt, and to maintain adequate dietary potassium and calcium preferably from fresh fruits, vegetables, and low-fat dairy products.    stressed the importance of regular exercise  Injury prevention: Discussed safety belts, safety helmets, smoke detector, smoking near bedding or upholstery.   Dental health: Discussed importance of regular tooth brushing, flossing, and dental visits.   Follow up plan: NEXT PREVENTATIVE PHYSICAL DUE IN 1 YEAR. Return in about 1 year (around 01/08/2024) for CPE.

## 2023-01-08 NOTE — Assessment & Plan Note (Signed)
Regularly lift weights with exercise routine. Expresses occasional pain in right knee after workout. Encouraged to use voltaren gel for discomfort.

## 2023-01-08 NOTE — Patient Instructions (Signed)
It was great to see you!  Keep up the great work!  You can try melatonin at bedtime to help with sleep  Let's follow-up in 1 year, sooner if you have concerns.  If a referral was placed today, you will be contacted for an appointment. Please note that routine referrals can sometimes take up to 3-4 weeks to process. Please call our office if you haven't heard anything after this time frame.  Take care,  Rodman Pickle, NP

## 2023-03-04 DIAGNOSIS — Z13220 Encounter for screening for lipoid disorders: Secondary | ICD-10-CM | POA: Diagnosis not present

## 2023-03-04 DIAGNOSIS — E291 Testicular hypofunction: Secondary | ICD-10-CM | POA: Diagnosis not present

## 2023-03-04 LAB — TSH: TSH: 1.25 (ref 0.41–5.90)

## 2023-03-04 LAB — CBC: RBC: 7.87 — AB (ref 3.87–5.11)

## 2023-03-04 LAB — CBC AND DIFFERENTIAL
HCT: 52 (ref 41–53)
Hemoglobin: 15.6 (ref 13.5–17.5)
Neutrophils Absolute: 3686
PSA, Total: 0.94
Platelets: 260 10*3/uL (ref 150–400)
WBC: 6.8

## 2023-03-11 ENCOUNTER — Encounter: Payer: Self-pay | Admitting: Nurse Practitioner

## 2023-03-12 ENCOUNTER — Encounter: Payer: Self-pay | Admitting: Nurse Practitioner

## 2023-03-25 DIAGNOSIS — G4709 Other insomnia: Secondary | ICD-10-CM | POA: Diagnosis not present

## 2023-03-25 DIAGNOSIS — E291 Testicular hypofunction: Secondary | ICD-10-CM | POA: Diagnosis not present

## 2023-03-25 DIAGNOSIS — R5382 Chronic fatigue, unspecified: Secondary | ICD-10-CM | POA: Diagnosis not present

## 2023-03-25 DIAGNOSIS — N529 Male erectile dysfunction, unspecified: Secondary | ICD-10-CM | POA: Diagnosis not present

## 2023-03-25 DIAGNOSIS — Z79890 Hormone replacement therapy (postmenopausal): Secondary | ICD-10-CM | POA: Diagnosis not present

## 2023-03-25 DIAGNOSIS — Z6832 Body mass index (BMI) 32.0-32.9, adult: Secondary | ICD-10-CM | POA: Diagnosis not present

## 2023-03-25 DIAGNOSIS — R5383 Other fatigue: Secondary | ICD-10-CM | POA: Diagnosis not present

## 2023-03-25 DIAGNOSIS — R6882 Decreased libido: Secondary | ICD-10-CM | POA: Diagnosis not present

## 2023-03-25 DIAGNOSIS — D751 Secondary polycythemia: Secondary | ICD-10-CM | POA: Diagnosis not present

## 2023-06-12 ENCOUNTER — Telehealth: Payer: 59 | Admitting: Physician Assistant

## 2023-06-12 DIAGNOSIS — B9689 Other specified bacterial agents as the cause of diseases classified elsewhere: Secondary | ICD-10-CM

## 2023-06-12 DIAGNOSIS — J069 Acute upper respiratory infection, unspecified: Secondary | ICD-10-CM | POA: Diagnosis not present

## 2023-06-12 MED ORDER — PREDNISONE 20 MG PO TABS
40.0000 mg | ORAL_TABLET | Freq: Every day | ORAL | 0 refills | Status: DC
Start: 2023-06-12 — End: 2023-07-01

## 2023-06-12 MED ORDER — BENZONATATE 100 MG PO CAPS
100.0000 mg | ORAL_CAPSULE | Freq: Three times a day (TID) | ORAL | 0 refills | Status: DC | PRN
Start: 2023-06-12 — End: 2023-07-01

## 2023-06-12 MED ORDER — AMOXICILLIN-POT CLAVULANATE 875-125 MG PO TABS
1.0000 | ORAL_TABLET | Freq: Two times a day (BID) | ORAL | 0 refills | Status: DC
Start: 2023-06-12 — End: 2023-06-26

## 2023-06-12 MED ORDER — PROMETHAZINE-DM 6.25-15 MG/5ML PO SYRP: 5.0000 mL | ORAL_SOLUTION | Freq: Four times a day (QID) | ORAL | 0 refills | Status: DC | PRN

## 2023-06-12 NOTE — Progress Notes (Signed)
Virtual Visit Consent   Lance Wright, you are scheduled for a virtual visit with a [facility] provider today. Just as with appointments in the office, your consent must be obtained to participate. Your consent will be active for this visit and any virtual visit you may have with one of our providers in the next 365 days. If you have a MyChart account, a copy of this consent can be sent to you electronically.  As this is a virtual visit, video technology does not allow for your provider to perform a traditional examination. This may limit your provider's ability to fully assess your condition. If your provider identifies any concerns that need to be evaluated in person or the need to arrange testing (such as labs, EKG, etc.), we will make arrangements to do so. Although advances in technology are sophisticated, we cannot ensure that it will always work on either your end or our end. If the connection with a video visit is poor, the visit may have to be switched to a telephone visit. With either a video or telephone visit, we are not always able to ensure that we have a secure connection.  By engaging in this virtual visit, you consent to the provision of healthcare and authorize for your insurance to be billed (if applicable) for the services provided during this visit. Depending on your insurance coverage, you may receive a charge related to this service.  I need to obtain your verbal consent now. Are you willing to proceed with your visit today? [name] has provided verbal consent on 06/12/2023 for a virtual visit (video or telephone). [name], PA-C  Date: 06/12/2023 7:38 PM  Virtual Visit via Video Note   I, [name], connected with  Lance Wright  ([id number], 1965-11-20) on 06/12/23 at  7:30 PM EST by a video-enabled telemedicine application and verified that I am speaking with the correct person using two identifiers.  Location: Patient: Virtual Visit  Location Patient: Home Provider: Virtual Visit Location Provider: Home Office   I discussed the limitations of evaluation and management by telemedicine and the availability of in person appointments. The patient expressed understanding and agreed to proceed.    History of Present Illness: Lance Wright is a 57 y.o. who identifies as a male who was assigned male at birth, and is being seen today for sinus congestion and cough.  HPI: URI  This is a new problem. The current episode started 1 to 4 weeks ago (3 weeks). The problem has been gradually worsening. There has been no fever. Associated symptoms include congestion, coughing, headaches, a plugged ear sensation (intermittently), rhinorrhea (and post nasal drainage) and sinus pain. Pertinent negatives include no chest pain, diarrhea, ear pain, nausea, sore throat or vomiting. Associated symptoms comments: Mild chills today, constipation. He has tried decongestant (Mucinex DM, afrin, dayquil/nyquil) for the symptoms. The treatment provided no relief.     Problems:  Patient Active Problem List   Diagnosis Date Noted   Routine general medical examination at a health care facility 01/08/2023   Arthralgia of right knee 01/08/2023   Prolapsed internal hemorrhoids, grade 2-3 08/08/2019   Iron deficiency 07/18/2019   Dyslipidemia 02/03/2017   Thalassemia trait 02/03/2017    Allergies: No Known Allergies Medications:  Current Outpatient Medications:    amoxicillin-clavulanate (AUGMENTIN) 875-125 MG tablet, Take 1 tablet by mouth 2 (two) times daily., Disp: 14 tablet, Rfl: 0   benzonatate (TESSALON) 100 MG capsule, Take 1-2 capsules (100-200  mg total) by mouth 3 (three) times daily as needed., Disp: 30 capsule, Rfl: 0   predniSONE (DELTASONE) 20 MG tablet, Take 2 tablets (40 mg total) by mouth daily with breakfast., Disp: 10 tablet, Rfl: 0   promethazine-dextromethorphan (PROMETHAZINE-DM) 6.25-15 MG/5ML syrup, Take 5 mLs by mouth 4 (four)  times daily as needed., Disp: 118 mL, Rfl: 0   Barberry-Oreg Grape-Goldenseal (BERBERINE COMPLEX PO), Take by mouth daily., Disp: , Rfl:    Cholecalciferol (VITAMIN D3 HIGH POTENCY PO), Take 5,000 mg by mouth daily., Disp: , Rfl:    co-enzyme Q-10 30 MG capsule, Take 30 mg by mouth daily., Disp: , Rfl:    DHEA 10 MG CAPS, Take by mouth daily., Disp: , Rfl:    omega-3 acid ethyl esters (LOVAZA) 1 g capsule, Take 1 g by mouth daily., Disp: , Rfl:    Probiotic Product (PROBIOTIC-10 PO), Take by mouth., Disp: , Rfl:    saw palmetto 160 MG capsule, Take 160 mg by mouth 2 (two) times daily., Disp: , Rfl:    sildenafil (VIAGRA) 50 MG tablet, TAKE 1 TABLET BY MOUTH ONCE DAILY AS NEEDED FOR ERECTILE DYSFUNCTION, Disp: 4 tablet, Rfl: 0  Observations/Objective: Patient is well-developed, well-nourished in no acute distress.  Resting comfortably at home.  Head is normocephalic, atraumatic.  No labored breathing.  Speech is clear and coherent with logical content.  Patient is alert and oriented at baseline.    Assessment and Plan: 1. Bacterial upper respiratory infection (Primary) - amoxicillin-clavulanate (AUGMENTIN) 875-125 MG tablet; Take 1 tablet by mouth 2 (two) times daily.  Dispense: 14 tablet; Refill: 0 - predniSONE (DELTASONE) 20 MG tablet; Take 2 tablets (40 mg total) by mouth daily with breakfast.  Dispense: 10 tablet; Refill: 0 - promethazine-dextromethorphan (PROMETHAZINE-DM) 6.25-15 MG/5ML syrup; Take 5 mLs by mouth 4 (four) times daily as needed.  Dispense: 118 mL; Refill: 0 - benzonatate (TESSALON) 100 MG capsule; Take 1-2 capsules (100-200 mg total) by mouth 3 (three) times daily as needed.  Dispense: 30 capsule; Refill: 0  - Worsening symptoms that have not responded to OTC medications.  - Will give Augmentin and Prednisone - Promethazine DM and Tessalon perles for cough - Continue allergy medications.  - Steam and humidifier can help - Stay well hydrated and get plenty of rest.   - Seek in person evaluation if no symptom improvement or if symptoms worsen   Follow Up Instructions: I discussed the assessment and treatment plan with the patient. The patient was provided an opportunity to ask questions and all were answered. The patient agreed with the plan and demonstrated an understanding of the instructions.  A copy of instructions were sent to the patient via MyChart unless otherwise noted below.    The patient was advised to call back or seek an in-person evaluation if the symptoms worsen or if the condition fails to improve as anticipated.    [name], PA-C

## 2023-06-12 NOTE — Patient Instructions (Signed)
Lance Wright, thank you for joining Lance Loveless, PA-C for today's virtual visit.  While this provider is not your primary care provider (PCP), if your PCP is located in our provider database this encounter information will be shared with them immediately following your visit.   A [facility] MyChart account gives you access to today's visit and all your visits, tests, and labs performed at [facility] " click here if you don't have a [facility] MyChart account or go to [email or web address]  Consent: (Patient) Lance Wright provided verbal consent for this virtual visit at the beginning of the encounter.  Current Medications:  Current Outpatient Medications:    amoxicillin-clavulanate (AUGMENTIN) 875-125 MG tablet, Take 1 tablet by mouth 2 (two) times daily., Disp: 14 tablet, Rfl: 0   benzonatate (TESSALON) 100 MG capsule, Take 1-2 capsules (100-200 mg total) by mouth 3 (three) times daily as needed., Disp: 30 capsule, Rfl: 0   predniSONE (DELTASONE) 20 MG tablet, Take 2 tablets (40 mg total) by mouth daily with breakfast., Disp: 10 tablet, Rfl: 0   promethazine-dextromethorphan (PROMETHAZINE-DM) 6.25-15 MG/5ML syrup, Take 5 mLs by mouth 4 (four) times daily as needed., Disp: 118 mL, Rfl: 0   Barberry-Oreg Grape-Goldenseal (BERBERINE COMPLEX PO), Take by mouth daily., Disp: , Rfl:    Cholecalciferol (VITAMIN D3 HIGH POTENCY PO), Take 5,000 mg by mouth daily., Disp: , Rfl:    co-enzyme Q-10 30 MG capsule, Take 30 mg by mouth daily., Disp: , Rfl:    DHEA 10 MG CAPS, Take by mouth daily., Disp: , Rfl:    omega-3 acid ethyl esters (LOVAZA) 1 g capsule, Take 1 g by mouth daily., Disp: , Rfl:    Probiotic Product (PROBIOTIC-10 PO), Take by mouth., Disp: , Rfl:    saw palmetto 160 MG capsule, Take 160 mg by mouth 2 (two) times daily., Disp: , Rfl:    sildenafil (VIAGRA) 50 MG tablet, TAKE 1 TABLET BY MOUTH ONCE DAILY AS NEEDED FOR ERECTILE DYSFUNCTION, Disp: 4 tablet,  Rfl: 0   Medications ordered in this encounter:  Meds ordered this encounter  Medications   amoxicillin-clavulanate (AUGMENTIN) 875-125 MG tablet    Sig: Take 1 tablet by mouth 2 (two) times daily.    Dispense:  14 tablet    Refill:  0    Supervising Provider:   Merrilee Jansky [[id number]]   predniSONE (DELTASONE) 20 MG tablet    Sig: Take 2 tablets (40 mg total) by mouth daily with breakfast.    Dispense:  10 tablet    Refill:  0    Supervising Provider:   Merrilee Jansky [[id number]]   promethazine-dextromethorphan (PROMETHAZINE-DM) 6.25-15 MG/5ML syrup    Sig: Take 5 mLs by mouth 4 (four) times daily as needed.    Dispense:  118 mL    Refill:  0    Supervising Provider:   Merrilee Jansky [[id number]]   benzonatate (TESSALON) 100 MG capsule    Sig: Take 1-2 capsules (100-200 mg total) by mouth 3 (three) times daily as needed.    Dispense:  30 capsule    Refill:  0    Supervising Provider:   Merrilee Jansky [[id number]]     *If you need refills on other medications prior to your next appointment, please contact your pharmacy*  Follow-Up: Call back or seek an in-person evaluation if the symptoms worsen or if the condition fails to improve as anticipated.  [facility] [phone]  Other Instructions  Upper Respiratory Infection, Adult An upper respiratory infection (URI) is a common viral infection of the nose, throat, and upper air passages that lead to the lungs. The most common type of URI is the common cold. URIs usually get better on their own, without medical treatment. What are the causes? A URI is caused by a virus. You may catch a virus by: Breathing in droplets from an infected person's cough or sneeze. Touching something that has been exposed to the virus (is contaminated) and then touching your mouth, nose, or eyes. What increases the risk? You are more likely to get a URI if: You are very young or very old. You have close contact with others, such  as at work, school, or a health care facility. You smoke. You have long-term (chronic) heart or lung disease. You have a weakened disease-fighting system (immune system). You have nasal allergies or asthma. You are experiencing a lot of stress. You have poor nutrition. What are the signs or symptoms? A URI usually involves some of the following symptoms: Runny or stuffy (congested) nose. Cough. Sneezing. Sore throat. Headache. Fatigue. Fever. Loss of appetite. Pain in your forehead, behind your eyes, and over your cheekbones (sinus pain). Muscle aches. Redness or irritation of the eyes. Pressure in the ears or face. How is this diagnosed? This condition may be diagnosed based on your medical history and symptoms, and a physical exam. Your health care provider may use a swab to take a mucus sample from your nose (nasal swab). This sample can be tested to determine what virus is causing the illness. How is this treated? URIs usually get better on their own within 7-10 days. Medicines cannot cure URIs, but your health care provider may recommend certain medicines to help relieve symptoms, such as: Over-the-counter cold medicines. Cough suppressants. Coughing is a type of defense against infection that helps to clear the respiratory system, so take these medicines only as recommended by your health care provider. Fever-reducing medicines. Follow these instructions at home: Activity Rest as needed. If you have a fever, stay home from work or school until your fever is gone or until your health care provider says your URI cannot spread to other people (is no longer contagious). Your health care provider may have you wear a face mask to prevent your infection from spreading. Relieving symptoms Gargle with a mixture of salt and water 3-4 times a day or as needed. To make salt water, completely dissolve -1 tsp (3-6 g) of salt in 1 cup (237 mL) of warm water. Use a cool-mist humidifier to  add moisture to the air. This can help you breathe more easily. Eating and drinking  Drink enough fluid to keep your urine pale yellow. Eat soups and other clear broths. General instructions  Take over-the-counter and prescription medicines only as told by your health care provider. These include cold medicines, fever reducers, and cough suppressants. Do not use any products that contain nicotine or tobacco. These products include cigarettes, chewing tobacco, and vaping devices, such as e-cigarettes. If you need help quitting, ask your health care provider. Stay away from secondhand smoke. Stay up to date on all immunizations, including the yearly (annual) flu vaccine. Keep all follow-up visits. This is important. How to prevent the spread of infection to others URIs can be contagious. To prevent the infection from spreading: Wash your hands with soap and water for at least 20 seconds. If soap and water are not available, use hand sanitizer. Avoid touching your  mouth, face, eyes, or nose. Cough or sneeze into a tissue or your sleeve or elbow instead of into your hand or into the air.  Contact a health care provider if: You are getting worse instead of better. You have a fever or chills. Your mucus is brown or red. You have yellow or brown discharge coming from your nose. You have pain in your face, especially when you bend forward. You have swollen neck glands. You have pain while swallowing. You have white areas in the back of your throat. Get help right away if: You have shortness of breath that gets worse. You have severe or persistent: Headache. Ear pain. Sinus pain. Chest pain. You have chronic lung disease along with any of the following: Making high-pitched whistling sounds when you breathe, most often when you breathe out (wheezing). Prolonged cough (more than 14 days). Coughing up blood. A change in your usual mucus. You have a stiff neck. You have changes in  your: Vision. Hearing. Thinking. Mood. These symptoms may be an emergency. Get help right away. Call 911. Do not wait to see if the symptoms will go away. Do not drive yourself to the hospital. Summary An upper respiratory infection (URI) is a common infection of the nose, throat, and upper air passages that lead to the lungs. A URI is caused by a virus. URIs usually get better on their own within 7-10 days. Medicines cannot cure URIs, but your health care provider may recommend certain medicines to help relieve symptoms. This information is not intended to replace advice given to you by your health care provider. Make sure you discuss any questions you have with your health care provider. Document Revised: 01/02/2021 Document Reviewed: 01/02/2021 Elsevier Patient Education  2024 Elsevier Inc.    If you have been instructed to have an in-person evaluation today at a local Urgent Care facility, please use the link below. It will take you to a list of all of our available [facility], including address, phone number and hours of operation. Please do not delay care.  [facility]  If you or a family member do not have a primary care provider, use the link below to schedule a visit and establish care. When you choose a [facility] primary care physician or advanced practice provider, you gain a long-term partner in health. Find a Primary Care Provider  Learn more about [facility]'s in-office and virtual care options:  - Get Care Now

## 2023-06-26 ENCOUNTER — Telehealth: Payer: 59 | Admitting: Nurse Practitioner

## 2023-06-26 DIAGNOSIS — J40 Bronchitis, not specified as acute or chronic: Secondary | ICD-10-CM | POA: Diagnosis not present

## 2023-06-26 MED ORDER — AZITHROMYCIN 250 MG PO TABS
ORAL_TABLET | ORAL | 0 refills | Status: DC
Start: 2023-06-26 — End: 2023-07-01

## 2023-06-26 MED ORDER — PROMETHAZINE-DM 6.25-15 MG/5ML PO SYRP
5.0000 mL | ORAL_SOLUTION | Freq: Four times a day (QID) | ORAL | 0 refills | Status: DC | PRN
Start: 2023-06-26 — End: 2023-07-10

## 2023-06-26 NOTE — Progress Notes (Signed)
 Virtual Visit Consent   Lance Wright, you are scheduled for a virtual visit with a [facility] provider today. Just as with appointments in the office, your consent must be obtained to participate. Your consent will be active for this visit and any virtual visit you may have with one of our providers in the next 365 days. If you have a MyChart account, a copy of this consent can be sent to you electronically.  As this is a virtual visit, video technology does not allow for your provider to perform a traditional examination. This may limit your provider's ability to fully assess your condition. If your provider identifies any concerns that need to be evaluated in person or the need to arrange testing (such as labs, EKG, etc.), we will make arrangements to do so. Although advances in technology are sophisticated, we cannot ensure that it will always work on either your end or our end. If the connection with a video visit is poor, the visit may have to be switched to a telephone visit. With either a video or telephone visit, we are not always able to ensure that we have a secure connection.  By engaging in this virtual visit, you consent to the provision of healthcare and authorize for your insurance to be billed (if applicable) for the services provided during this visit. Depending on your insurance coverage, you may receive a charge related to this service.  I need to obtain your verbal consent now. Are you willing to proceed with your visit today? Lance Wright has provided verbal consent on 06/26/2023 for a virtual visit (video or telephone). Lauraine Kitty, FNP  Date: 06/26/2023 9:29 AM  Virtual Visit via Video Note   I, Lauraine Kitty, connected with  Lance Wright  ([id number], Oct 24, 1965) on 06/26/23 at  9:30 AM EST by a video-enabled telemedicine application and verified that I am speaking with the correct person using two identifiers.  Location: Patient: Virtual Visit Location Patient:  Home Provider: Virtual Visit Location Provider: Home Office   I discussed the limitations of evaluation and management by telemedicine and the availability of in person appointments. The patient expressed understanding and agreed to proceed.    History of Present Illness: Lance Wright is a 58 y.o. who identifies as a male who was assigned male at birth, and is being seen today for onset of chest congestion - cough has becoming increasingly productive over the past two days  Also noted that the mucous has a foul taste and smell   He was treated for a sinus infection in December, given Augmentin  + cough support with prednisone    History of chronic sinusitis and has had 5 surgeries in the past  Denies a history of MRSA or complications in wound healing after surgeries   Denies a history of asthma or need for inhalers in the past    Problems:  Patient Active Problem List   Diagnosis Date Noted   Routine general medical examination at a health care facility 01/08/2023   Arthralgia of right knee 01/08/2023   Prolapsed internal hemorrhoids, grade 2-3 08/08/2019   Iron deficiency 07/18/2019   Dyslipidemia 02/03/2017   Thalassemia trait 02/03/2017    Allergies: No Known Allergies Medications:  Current Outpatient Medications:    Barberry-Oreg Grape-Goldenseal (BERBERINE COMPLEX PO), Take by mouth daily., Disp: , Rfl:    benzonatate  (TESSALON ) 100 MG capsule, Take 1-2 capsules (100-200 mg total) by mouth 3 (three) times daily as needed., Disp: 30 capsule, Rfl:  0   Cholecalciferol (VITAMIN D3 HIGH POTENCY PO), Take 5,000 mg by mouth daily., Disp: , Rfl:    co-enzyme Q-10 30 MG capsule, Take 30 mg by mouth daily., Disp: , Rfl:    DHEA 10 MG CAPS, Take by mouth daily., Disp: , Rfl:    omega-3 acid ethyl esters (LOVAZA) 1 g capsule, Take 1 g by mouth daily., Disp: , Rfl:    predniSONE  (DELTASONE ) 20 MG tablet, Take 2 tablets (40 mg total) by mouth daily with breakfast., Disp: 10 tablet, Rfl:  0   Probiotic Product (PROBIOTIC-10 PO), Take by mouth., Disp: , Rfl:    promethazine -dextromethorphan (PROMETHAZINE -DM) 6.25-15 MG/5ML syrup, Take 5 mLs by mouth 4 (four) times daily as needed., Disp: 118 mL, Rfl: 0   saw palmetto 160 MG capsule, Take 160 mg by mouth 2 (two) times daily., Disp: , Rfl:    sildenafil  (VIAGRA ) 50 MG tablet, TAKE 1 TABLET BY MOUTH ONCE DAILY AS NEEDED FOR ERECTILE DYSFUNCTION, Disp: 4 tablet, Rfl: 0   Observations/Objective: Patient is well-developed, well-nourished in no acute distress.  Resting comfortably  at home.  Head is normocephalic, atraumatic.  No labored breathing.  Speech is clear and coherent with logical content.  Patient is alert and oriented at baseline.    Assessment and Plan:  1. Bronchitis (Primary)   Advised using mucinex in the daytime increasing fluids and may use promethazine  DM at bedtime for cough support   - azithromycin  (ZITHROMAX ) 250 MG tablet; Take 2 tablets on day 1, then 1 tablet daily on days 2 through 5  Dispense: 6 tablet; Refill: 0 - promethazine -dextromethorphan (PROMETHAZINE -DM) 6.25-15 MG/5ML syrup; Take 5 mLs by mouth 4 (four) times daily as needed for cough.  Dispense: 118 mL; Refill: 0       Follow Up Instructions: I discussed the assessment and treatment plan with the patient. The patient was provided an opportunity to ask questions and all were answered. The patient agreed with the plan and demonstrated an understanding of the instructions.  A copy of instructions were sent to the patient via MyChart unless otherwise noted below.    The patient was advised to call back or seek an in-person evaluation if the symptoms worsen or if the condition fails to improve as anticipated.    Lauraine Kitty, FNP

## 2023-07-01 ENCOUNTER — Ambulatory Visit: Payer: 59 | Admitting: Internal Medicine

## 2023-07-01 ENCOUNTER — Encounter: Payer: Self-pay | Admitting: Internal Medicine

## 2023-07-01 VITALS — BP 132/90 | HR 80 | Ht 66.5 in | Wt 196.4 lb

## 2023-07-01 DIAGNOSIS — K429 Umbilical hernia without obstruction or gangrene: Secondary | ICD-10-CM

## 2023-07-01 DIAGNOSIS — K589 Irritable bowel syndrome without diarrhea: Secondary | ICD-10-CM

## 2023-07-01 DIAGNOSIS — M6208 Separation of muscle (nontraumatic), other site: Secondary | ICD-10-CM | POA: Diagnosis not present

## 2023-07-01 DIAGNOSIS — R194 Change in bowel habit: Secondary | ICD-10-CM

## 2023-07-01 DIAGNOSIS — R140 Abdominal distension (gaseous): Secondary | ICD-10-CM

## 2023-07-01 NOTE — Patient Instructions (Signed)
 You have been scheduled for a CT scan of the abdomen and pelvis at [facility], 1st floor Radiology. You are scheduled on 07/10/2023 at 1:15 arrive to drink the contrast..  Please follow the written instructions below on the day of your exam:   The purpose of you drinking the oral contrast is to aid in the visualization of your intestinal tract. The contrast solution may cause some diarrhea. Depending on your individual set of symptoms, you may also receive an intravenous injection of x-ray contrast/dye. If you have any questions regarding your exam or if you need to reschedule, you may call [facility] at [phone] between the hours of 8:00 am and 5:00 pm, Monday-Friday.   _______________________________________________________  If your blood pressure at your visit was 140/90 or greater, please contact your primary care physician to follow up on this.  _______________________________________________________  If you are age 77 or older, your body mass index should be between 23-30. Your Body mass index is 31.22 kg/m. If this is out of the aforementioned range listed, please consider follow up with your Primary Care Provider.  If you are age 54 or younger, your body mass index should be between 19-25. Your Body mass index is 31.22 kg/m. If this is out of the aformentioned range listed, please consider follow up with your Primary Care Provider.   ________________________________________________________  The [facility] providers would like to encourage you to use MYCHART to communicate with providers for non-urgent requests or questions.  Due to long hold times on the telephone, sending your provider a message by [facility] may be a faster and more efficient way to get a response.  Please allow 48 business hours for a response.  Please remember that this is for non-urgent requests.  _______________________________________________________   I appreciate the opportunity to care for  you. Loy Ruff, MD, [facility]

## 2023-07-01 NOTE — Progress Notes (Signed)
 Lance Wright 57 y.o. Jan 21, 1966 [id number]  Assessment & Plan:   Encounter Diagnoses  Name Primary?   Irritable bowel syndrome vs post-cholecystectomy defecation change Yes   Umbilical hernia without obstruction and without gangrene    Diastasis recti vs ventral hernia     It sounds like he probably has a postcholecystectomy defecation change.  May be IBS could be both.  Given the previous evaluation with EGD and colonoscopy and that he is comfortable dealing with this would not intervene differently at this time.  He is not interested in medication for this issue.  Cholestyramine could be considered though he is not really having loose or watery stools.  Has a small umbilical hernia and I think a diastases recti above that.  I am going to investigate with a CT abdomen and pelvis to be more certain.  I have explained that physical therapy could potentially be useful.  If there is a diastases recti and a small umbilical hernia I do not think surgical intervention or referral is warranted but could be depending upon the CT findings.   Subjective:  The patient consented to the use of artificial intelligence scribe.  Chief Complaint: Bloating, abdominal distention and defecation issues  HPI-  58 year old man with a history of iron deficiency anemia and unrevealing EGD and colonoscopy as outlined below (2021), hemorrhoids status post ligation, who presents with complaints of abdominal bloating and distention, particularly noticeable post-meal, leading to immediate defecation. This bloating is localized to the front of the abdomen, despite the patient's regular workout routine and lack of visible fat in the area. The patient's spouse has expressed concern about this issue, which has been present for several years but has not previously been discussed with a healthcare provider.  The patient's second concern is a potential hernia, which has been growing over the past year. This protrusion  is visible during abdominal workouts and is located in the center of the abdomen. Despite the visible protrusion, the patient reports no associated pain or discomfort, even during heavy lifting.  He has a picture of it showing a midline bulge above the umbilicus.  The patient's bowel movements are frequent, often occurring shortly after meals, but are well-formed and do not contain visible fat. This pattern of defecation has been consistent since the patient's gallbladder removal. The patient maintains a healthy diet, rich in fiber and vegetables.  Hemorrhoids are not an issue status post banding he reports.     Colonoscopy 07/18/2019 The examined portion of the ileum was normal. - Internal hemorrhoids. - The examination was otherwise normal on direct and retroflexion views. - No specimens collected. EGD 2/12021 - LA Grade A reflux esophagitis with no bleeding. Biopsied. Bottle 3 - Non-obstructing Schatzki ring. Biopsied. Bottle 3 - 4 cm hiatal hernia. - Gastritis. Biopsied. Bottle 2 - Mucosal changes in the duodenum. Biopsied. Bottle 1 - The examination was otherwise normal.   1. Surgical [P], duodenal BX - DUODENAL MUCOSA WITH NO SIGNIFICANT PATHOLOGIC FINDINGS. - NEGATIVE FOR INCREASED INTRAEPITHELIAL LYMPHOCYTES AND VILLOUS ARCHITECTURAL CHANGES. 2. Surgical [P], gastric antrum - GASTRIC ANTRAL MUCOSA WITH MILD REACTIVE GASTROPATHY. - GASTRIC OXYNTIC MUCOSA WITH MILD CHRONIC GASTRITIS. - WARTHIN-STARRY STAIN IS NEGATIVE FOR HELICOBACTER PYLORI. 3. Surgical [P], esophagus, GE junction - SQUAMOCOLUMNAR ESOPHAGEAL MUCOSA WITH REACTIVE/REGENERATIVE CHANGES. - NEGATIVE FOR INTESTINAL METAPLASIA (GOBLET CELL METAPLASIA). - NEGATIVE FOR NEOPLASIA.    Wt Readings from Last 3 Encounters:  07/01/23 196 lb 6 oz (89.1 kg)  01/08/23 198 lb (89.8 kg)  09/23/21 193 lb 6.4 oz (87.7 kg)      No Known Allergies Current Meds  Medication Sig   Barberry-Oreg Grape-Goldenseal (BERBERINE COMPLEX  PO) Take by mouth daily.   Cholecalciferol (VITAMIN D3 HIGH POTENCY PO) Take 5,000 mg by mouth daily.   co-enzyme Q-10 30 MG capsule Take 30 mg by mouth daily.   DHEA 10 MG CAPS Take by mouth daily.   omega-3 acid ethyl esters (LOVAZA) 1 g capsule Take 1 g by mouth daily.   Probiotic Product (PROBIOTIC-10 PO) Take by mouth.   promethazine -dextromethorphan (PROMETHAZINE -DM) 6.25-15 MG/5ML syrup Take 5 mLs by mouth 4 (four) times daily as needed for cough.   saw palmetto 160 MG capsule Take 160 mg by mouth 2 (two) times daily.   sildenafil  (VIAGRA ) 50 MG tablet TAKE 1 TABLET BY MOUTH ONCE DAILY AS NEEDED FOR ERECTILE DYSFUNCTION   Past Medical History:  Diagnosis Date   ADHD (attention deficit hyperactivity disorder)    Bleeding internal hemorrhoids 07/18/2019   ED (erectile dysfunction)    Elevated LFTs    mild, chronic per pt. Reportedly normal w/u (no records available). 09/2016 SGPT/ALT 52, T bili 1.9   Gallstones    Hyperlipidemia    low HDL, elevated TG   Iron deficiency 07/18/2019   Ferritin 17   Testosterone  deficiency    Thalassemia    Past Surgical History:  Procedure Laterality Date   CHOLECYSTECTOMY, LAPAROSCOPIC  2016   COLONOSCOPY  2012   hemorrhoids   FUNCTIONAL ENDOSCOPIC SINUS SURGERY Right 06/30/2014   HEMORRHOID BANDING     HIP ARTHROSCOPY Left 2009-2010   REPAIR DURAL / CSF LEAK  06/30/2014   SINUS EXPLORATION     one surgery was complicated by CSF leak, requiring patch   VASECTOMY     Social History   Social History Narrative   Married with children   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   Review of Systems As per  HPI Otherwise negative Objective:   Physical Exam BP (!) 132/90 (BP Location: Left Arm, Patient Position: Sitting, Cuff Size: Large)   Pulse 80   Ht 5' 6.5" (1.689 m)   Wt 196 lb 6 oz (89.1 kg)   BMI 31.22 kg/m  Well-developed well-nourished muscular white man in no acute distress. He is somewhat barrel chested and has a large but not obese upper body.    The abdomen is muscular but there is a protrusion above the umbilicus extending several centimeters in length and about 3 cm wide.  I do not think I palpate any organs, I suspect this is a diastases recti.  There is a small soft umbilical hernia suspected more visible when he is standing.  The abdomen is nontender it is otherwise muscular bowel sounds are present there are no masses

## 2023-07-02 ENCOUNTER — Encounter: Payer: Self-pay | Admitting: Nurse Practitioner

## 2023-07-02 ENCOUNTER — Ambulatory Visit: Payer: 59 | Admitting: Nurse Practitioner

## 2023-07-02 VITALS — BP 114/88 | HR 75 | Temp 97.8°F | Ht 66.5 in | Wt 191.0 lb

## 2023-07-02 DIAGNOSIS — J22 Unspecified acute lower respiratory infection: Secondary | ICD-10-CM | POA: Insufficient documentation

## 2023-07-02 MED ORDER — PREDNISONE 20 MG PO TABS: 20.0000 mg | ORAL_TABLET | Freq: Every day | ORAL | 0 refills | Status: DC

## 2023-07-02 MED ORDER — LEVOFLOXACIN 500 MG PO TABS: 500.0000 mg | ORAL_TABLET | Freq: Every day | ORAL | 0 refills | Status: AC

## 2023-07-02 NOTE — Patient Instructions (Signed)
It was great to see you!  Start levaquin 1 tablet daily for 7 days  Start prednisone 2 tablets daily in the morning with food  Keep taking mucinex twice a day to help break up the congestion  Keep drinking plenty of fluids  Don't do any weight lifting for 2  weeks  Let's follow-up if your symptoms worsen or don't improve.   Take care,  Rodman Pickle, NP

## 2023-07-02 NOTE — Assessment & Plan Note (Signed)
He has been sick for over a month.  He he was treated with Augmentin first which helped, but his symptoms returned and then he was treated with azithromycin which did not help.  He does have rhonchi in his right lower lobe.  Concern for pneumonia, however he declines chest x-ray today.  Will have him start Levaquin 500 mg daily for 7 days.  Will also have him start prednisone 40 mg daily for 5 days with food in the morning.  Discussed limiting strenuous exercise while on the Levaquin and for a week afterwards.  Continue Mucinex twice a day and encourage fluids.  Follow-up if symptoms worsen or do not improve.

## 2023-07-02 NOTE — Progress Notes (Signed)
Acute Office Visit  Subjective:     Patient ID: Lance Wright, male    DOB: 26-Dec-1965, 58 y.o.   MRN: [id number]  Chief Complaint  Patient presents with   Cough    For 1 month, chest congestion, runny nose, fatigue, lightheaded when coughs alot    HPI Patient is in today for ongoing chest congestion, fatigue, lightheadedness with coughing.  He notes that he was sick a few days before Christmas and was sick for 3 weeks.  He did a virtual visit and was prescribed Augmentin which he states helped him get better but a few days later his symptoms returned.  He did another e-visit and was prescribed azithromycin which she feels like did not help at all.  He is currently feeling significant fatigue, sinus drainage, congested/slightly productive cough.  He notes that he has not had any fevers.  He tried to go to the gym, however felt too fatigued and weak to do this.  He denies ear pain, sore throat.  He states that he does get dizzy with coughing spells.  In addition to the antibiotics, he has tried Mucinex sinus, Promethazine DM, and Tessalon Perles.  ROS See pertinent positives and negatives per HPI.     Objective:    BP 114/88 (BP Location: Left Arm, Patient Position: Sitting, Cuff Size: Normal)   Pulse 75   Temp 97.8 F (36.6 C) (Oral)   Ht 5' 6.5" (1.689 m)   Wt 191 lb (86.6 kg)   SpO2 96%   BMI 30.37 kg/m    Physical Exam Vitals and nursing note reviewed.  Constitutional:      Appearance: Normal appearance.  HENT:     Head: Normocephalic.     Right Ear: Tympanic membrane, ear canal and external ear normal.     Left Ear: Tympanic membrane, ear canal and external ear normal.  Eyes:     Conjunctiva/sclera: Conjunctivae normal.  Cardiovascular:     Rate and Rhythm: Normal rate and regular rhythm.     Pulses: Normal pulses.     Heart sounds: Normal heart sounds.  Pulmonary:     Effort: Pulmonary effort is normal.     Breath sounds: Rhonchi (RLL) present.   Musculoskeletal:     Cervical back: Normal range of motion and neck supple. No tenderness.  Lymphadenopathy:     Cervical: No cervical adenopathy.  Skin:    General: Skin is warm.  Neurological:     General: No focal deficit present.     Mental Status: He is alert and oriented to person, place, and time.  Psychiatric:        Mood and Affect: Mood normal.        Behavior: Behavior normal.        Thought Content: Thought content normal.        Judgment: Judgment normal.      Assessment & Plan:   Problem List Items Addressed This Visit       Respiratory   Lower respiratory infection - Primary   He has been sick for over a month.  He he was treated with Augmentin first which helped, but his symptoms returned and then he was treated with azithromycin which did not help.  He does have rhonchi in his right lower lobe.  Concern for pneumonia, however he declines chest x-ray today.  Will have him start Levaquin 500 mg daily for 7 days.  Will also have him start prednisone 40 mg daily for 5  days with food in the morning.  Discussed limiting strenuous exercise while on the Levaquin and for a week afterwards.  Continue Mucinex twice a day and encourage fluids.  Follow-up if symptoms worsen or do not improve.       Meds ordered this encounter  Medications   levofloxacin (LEVAQUIN) 500 MG tablet    Sig: Take 1 tablet (500 mg total) by mouth daily for 7 days.    Dispense:  7 tablet    Refill:  0   predniSONE (DELTASONE) 20 MG tablet    Sig: Take 1 tablet (20 mg total) by mouth daily with breakfast.    Dispense:  10 tablet    Refill:  0    Return if symptoms worsen or fail to improve.  Gerre Scull, NP

## 2023-07-07 ENCOUNTER — Encounter: Payer: Self-pay | Admitting: Nurse Practitioner

## 2023-07-08 ENCOUNTER — Other Ambulatory Visit: Payer: Self-pay | Admitting: Family

## 2023-07-10 ENCOUNTER — Other Ambulatory Visit: Payer: Self-pay

## 2023-07-10 ENCOUNTER — Ambulatory Visit (HOSPITAL_COMMUNITY): Payer: 59

## 2023-07-10 ENCOUNTER — Other Ambulatory Visit: Payer: Self-pay | Admitting: Family

## 2023-07-10 ENCOUNTER — Encounter: Payer: Self-pay | Admitting: Pharmacist

## 2023-07-10 DIAGNOSIS — J40 Bronchitis, not specified as acute or chronic: Secondary | ICD-10-CM

## 2023-07-10 MED ORDER — PROMETHAZINE-DM 6.25-15 MG/5ML PO SYRP
5.0000 mL | ORAL_SOLUTION | Freq: Four times a day (QID) | ORAL | 0 refills | Status: DC | PRN
  Filled 2023-07-10: qty 118, 6d supply, fill #0

## 2023-07-11 ENCOUNTER — Other Ambulatory Visit (HOSPITAL_COMMUNITY): Payer: Self-pay

## 2023-07-15 ENCOUNTER — Other Ambulatory Visit: Payer: Self-pay

## 2023-07-17 ENCOUNTER — Ambulatory Visit: Payer: 59 | Admitting: Family Medicine

## 2023-07-17 ENCOUNTER — Encounter: Payer: Self-pay | Admitting: Family Medicine

## 2023-07-17 VITALS — BP 144/82 | Temp 98.5°F | Ht 66.5 in | Wt 196.4 lb

## 2023-07-17 DIAGNOSIS — R052 Subacute cough: Secondary | ICD-10-CM | POA: Diagnosis not present

## 2023-07-17 MED ORDER — PROMETHAZINE-DM 6.25-15 MG/5ML PO SYRP: 5.0000 mL | ORAL_SOLUTION | Freq: Four times a day (QID) | ORAL | 0 refills | Status: DC | PRN

## 2023-07-17 NOTE — Assessment & Plan Note (Signed)
Mr. Wimberly illness sounds like it started with bronchitis. I am not surprised that the first 2 courses of antibiotics did not help him. At his last visit, he did show physical exam findings of possible RLL pneumonia. His lungs today are clear. I will check a CXR to rule out other causes of cough. I will renew his cough syrup. I suspect that he has some post-bronchitic cough that will eventually resolve.

## 2023-07-17 NOTE — Progress Notes (Signed)
[facility] [address] [place] [address] Dept: [phone] Dept Fax: [phone]  Office Visit  Subjective:    Patient ID: Lance Wright, male    DOB: 21-May-1966, 58 y.o..   MRN: [id number]  Chief Complaint  Patient presents with   Cough    C/o still having persistent cough x 2 months.      History of Present Illness:  Patient is in today for reassessment of an ongoing cough. Lance Wright first became sick a few days prior to Christmas, primarily with chest congestion and cough. He was seen for a video visit on 12/27 and prescribed Augmentin, prednisone, and cough medicines. As he was not improved, he was seen again for a video visit on 1/10 and prescribed azithromycin. He was seen in clinic by Ms. McElwee on 1/16 with ongoing fatigue, sinus drainage, and a congested cough. He was found to have rhonchi in the RLL. He was treated with levofloxacin and prednisone. He notes at this point, he continues to cough, bringing up some mucous. This has interfered with his sleep, which is contributing to fatigue.  Past Medical History: Patient Active Problem List   Diagnosis Date Noted   Lower respiratory infection 07/02/2023   Routine general medical examination at a health care facility 01/08/2023   Arthralgia of right knee 01/08/2023   Prolapsed internal hemorrhoids, grade 2-3 08/08/2019   Iron deficiency 07/18/2019   Dyslipidemia 02/03/2017   Thalassemia trait 02/03/2017   Past Surgical History:  Procedure Laterality Date   CHOLECYSTECTOMY, LAPAROSCOPIC  2016   COLONOSCOPY  2012   hemorrhoids   FUNCTIONAL ENDOSCOPIC SINUS SURGERY Right 06/30/2014   HEMORRHOID BANDING     HIP ARTHROSCOPY Left 2009-2010   REPAIR DURAL / CSF LEAK  06/30/2014   SINUS EXPLORATION     one surgery was complicated by CSF leak, requiring patch   VASECTOMY     Family History  Problem Relation Age of Onset   Osteoporosis Mother    Cancer  Father        non-hodgkin lymphoma   COPD Father    Hypertension Father    Obesity Father    Allergies Brother    Diabetes type I Son    CAD Paternal Grandfather    Diverticulitis Paternal Grandfather    Colon cancer Neg Hx    Esophageal cancer Neg Hx    Liver cancer Neg Hx    Stomach cancer Neg Hx    Outpatient Medications Prior to Visit  Medication Sig Dispense Refill   Barberry-Oreg Grape-Goldenseal (BERBERINE COMPLEX PO) Take by mouth daily.     Cholecalciferol (VITAMIN D3 HIGH POTENCY PO) Take 5,000 mg by mouth daily.     co-enzyme Q-10 30 MG capsule Take 30 mg by mouth daily.     DHEA 10 MG CAPS Take by mouth daily.     omega-3 acid ethyl esters (LOVAZA) 1 g capsule Take 1 g by mouth daily.     Probiotic Product (PROBIOTIC-10 PO) Take by mouth.     saw palmetto 160 MG capsule Take 160 mg by mouth 2 (two) times daily.     sildenafil (VIAGRA) 50 MG tablet TAKE 1 TABLET BY MOUTH ONCE DAILY AS NEEDED FOR ERECTILE DYSFUNCTION 4 tablet 0   promethazine-dextromethorphan (PROMETHAZINE-DM) 6.25-15 MG/5ML syrup Take 5 mLs by mouth 4 (four) times daily as needed for cough. 118 mL 0   predniSONE (DELTASONE) 20 MG tablet Take 1 tablet (20 mg total) by mouth  daily with breakfast. 10 tablet 0   No facility-administered medications prior to visit.   No Known Allergies   Objective:   Today's Vitals   07/17/23 1307  BP: (!) 144/82  Temp: 98.5 F (36.9 C)  TempSrc: Temporal  Weight: 196 lb 6.4 oz (89.1 kg)  Height: 5' 6.5" (1.689 m)   Body mass index is 31.22 kg/m.   General: Well developed, well nourished. No acute distress. HEENT: Normocephalic, non-traumatic. Conjunctiva clear. External ears normal. EAC and TMs normal   bilaterally. Nose clear without congestion or rhinorrhea. Mucous membranes moist. Oropharynx clear.   Good dentition. Neck: Supple. No lymphadenopathy. No thyromegaly. Lungs: Clear to auscultation bilaterally. No wheezing, rales or rhonchi. Normal inspiratory  and   expiratory phase to respirations. Psych: Alert and oriented. Normal mood and affect.  Health Maintenance Due  Topic Date Due   Zoster Vaccines- Shingrix (1 of 2) Never done     Assessment & Plan:   Problem List Items Addressed This Visit       Other   Subacute cough - Primary   Lance Wright illness sounds like it started with bronchitis. I am not surprised that the first 2 courses of antibiotics did not help him. At his last visit, he did show physical exam findings of possible RLL pneumonia. His lungs today are clear. I will check a CXR to rule out other causes of cough. I will renew his cough syrup. I suspect that he has some post-bronchitic cough that will eventually resolve.      Relevant Medications   promethazine-dextromethorphan (PROMETHAZINE-DM) 6.25-15 MG/5ML syrup   Other Relevant Orders   DG Chest 2 View    Return for Follow-up after testing/imaging.   Loyola Mast, MD

## 2023-07-21 ENCOUNTER — Ambulatory Visit (INDEPENDENT_AMBULATORY_CARE_PROVIDER_SITE_OTHER): Payer: 59

## 2023-07-21 DIAGNOSIS — R059 Cough, unspecified: Secondary | ICD-10-CM | POA: Diagnosis not present

## 2023-07-21 DIAGNOSIS — R052 Subacute cough: Secondary | ICD-10-CM

## 2023-07-23 ENCOUNTER — Encounter: Payer: Self-pay | Admitting: Family Medicine

## 2023-07-23 DIAGNOSIS — R052 Subacute cough: Secondary | ICD-10-CM

## 2023-07-23 MED ORDER — PROMETHAZINE-DM 6.25-15 MG/5ML PO SYRP: 5.0000 mL | ORAL_SOLUTION | Freq: Four times a day (QID) | ORAL | 0 refills | Status: DC | PRN

## 2023-09-09 DIAGNOSIS — E291 Testicular hypofunction: Secondary | ICD-10-CM | POA: Diagnosis not present

## 2023-09-09 LAB — CBC AND DIFFERENTIAL
HCT: 53 (ref 41–53)
Hemoglobin: 16.1 (ref 13.5–17.5)
Neutrophils Absolute: 4193
Platelets: 265 K/uL (ref 150–400)
WBC: 7.5

## 2023-09-09 LAB — CBC
Free T4: 1.4 ng/dL
PSA, Total: 1.84
RBC: 8.2 — AB (ref 3.87–5.11)
TSH: 1.46

## 2023-09-09 LAB — TSH: TSH: 1.46 (ref 0.41–5.90)

## 2023-09-23 DIAGNOSIS — M2559 Pain in other specified joint: Secondary | ICD-10-CM | POA: Diagnosis not present

## 2023-09-23 DIAGNOSIS — E291 Testicular hypofunction: Secondary | ICD-10-CM | POA: Diagnosis not present

## 2023-09-23 DIAGNOSIS — Z79890 Hormone replacement therapy (postmenopausal): Secondary | ICD-10-CM | POA: Diagnosis not present

## 2023-09-23 DIAGNOSIS — F5101 Primary insomnia: Secondary | ICD-10-CM | POA: Diagnosis not present

## 2023-09-23 DIAGNOSIS — Z6831 Body mass index (BMI) 31.0-31.9, adult: Secondary | ICD-10-CM | POA: Diagnosis not present

## 2023-10-21 ENCOUNTER — Encounter: Admitting: Nurse Practitioner

## 2023-11-30 DIAGNOSIS — H5213 Myopia, bilateral: Secondary | ICD-10-CM | POA: Diagnosis not present

## 2024-01-11 ENCOUNTER — Encounter: Payer: Self-pay | Admitting: Nurse Practitioner

## 2024-01-11 ENCOUNTER — Ambulatory Visit (INDEPENDENT_AMBULATORY_CARE_PROVIDER_SITE_OTHER): Admitting: Nurse Practitioner

## 2024-01-11 VITALS — BP 122/88 | HR 79 | Temp 96.9°F | Ht 66.5 in | Wt 199.6 lb

## 2024-01-11 DIAGNOSIS — E782 Mixed hyperlipidemia: Secondary | ICD-10-CM

## 2024-01-11 DIAGNOSIS — N529 Male erectile dysfunction, unspecified: Secondary | ICD-10-CM | POA: Diagnosis not present

## 2024-01-11 DIAGNOSIS — Z0000 Encounter for general adult medical examination without abnormal findings: Secondary | ICD-10-CM

## 2024-01-11 DIAGNOSIS — R7989 Other specified abnormal findings of blood chemistry: Secondary | ICD-10-CM | POA: Diagnosis not present

## 2024-01-11 NOTE — Progress Notes (Signed)
 BP 122/88 (BP Location: Left Arm, Patient Position: Sitting, Cuff Size: Normal)   Pulse 79   Temp (!) 96.9 F (36.1 C)   Ht 5' 6.5 (1.689 m)   Wt 199 lb 9.6 oz (90.5 kg)   SpO2 96%   BMI 31.73 kg/m    Subjective:    Patient ID: Lance Wright, male    DOB: 1965/06/22, 58 y.o.   MRN: [id number]  CC: Chief Complaint  Patient presents with   Annual Exam    No concerns    HPI: Lance Wright is a 58 y.o. male presenting on 01/11/2024 for comprehensive medical examination. Current medical complaints include:none   Depression and Anxiety Screen done today and results listed below:     01/11/2024    3:14 PM 01/08/2023    2:52 PM 09/23/2021    2:17 PM  Depression screen PHQ 2/9  Decreased Interest 0 0 0  Down, Depressed, Hopeless 0 0 0  PHQ - 2 Score 0 0 0  Altered sleeping 0 1 1  Tired, decreased energy 0  0  Change in appetite 0 0 0  Feeling bad or failure about yourself  0 0 0  Trouble concentrating 0 0 0  Moving slowly or fidgety/restless 0 0 0  Suicidal thoughts 0 0 0  PHQ-9 Score 0 1 1  Difficult doing work/chores Not difficult at all  Not difficult at all      01/11/2024    3:14 PM 01/08/2023    2:53 PM 09/23/2021    2:18 PM  GAD 7 : Generalized Anxiety Score  Nervous, Anxious, on Edge 0 0 0  Control/stop worrying 0 0 0  Worry too much - different things 0 0 0  Trouble relaxing 0 0 0  Restless 0 1 0  Easily annoyed or irritable 0 0 0  Afraid - awful might happen 0 0 0  Total GAD 7 Score 0 1 0  Anxiety Difficulty Not difficult at all      The patient does not have a history of falls. I did not complete a risk assessment for falls. A plan of care for falls was not documented.   Past Medical History:  Past Medical History:  Diagnosis Date   ADHD (attention deficit hyperactivity disorder)    Bleeding internal hemorrhoids 07/18/2019   ED (erectile dysfunction)    Elevated LFTs    mild, chronic per pt. Reportedly normal w/u (no records available). 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   Cholecalciferol (VITAMIN D3 HIGH POTENCY PO) Take 5,000 mg by mouth daily.   co-enzyme Q-10 30 MG capsule Take 30 mg by mouth daily.  DHEA 10 MG CAPS Take by mouth daily.   omega-3 acid ethyl esters (LOVAZA) 1 g capsule Take 1 g by mouth daily.   Probiotic Product (PROBIOTIC-10 PO) Take by mouth.   saw palmetto 160 MG capsule Take 160 mg by mouth 2 (two) times daily.   sildenafil  (VIAGRA ) 50 MG tablet TAKE 1 TABLET BY MOUTH ONCE DAILY AS NEEDED FOR ERECTILE DYSFUNCTION   No current facility-administered medications on file prior to visit.    Allergies:  No Known Allergies  Social History:  Social History   Socioeconomic History   Marital status: Married    Spouse name: Not on file   Number of children: Not on file   Years of education: Not on file   Highest education level: Master's degree (e.g., MA, MS, MEng, MEd, MSW, MBA)  Occupational History   Not on file  Tobacco Use   Smoking status: Never   Smokeless tobacco: Never  Vaping Use   Vaping status: Never Used  Substance and Sexual Activity   Alcohol use: No   Drug use: No   Sexual activity: Yes    Partners: Female    Birth control/protection: Surgical  Other Topics  Concern   Not on file  Social History Narrative   Married with children   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, [place]   Weight lifter   No alcohol tobacco or drug use   Social Drivers of Corporate investment banker Strain: Low Risk  (01/10/2024)   Overall Financial Resource Strain (CARDIA)    Difficulty of Paying Living Expenses: Not very hard  Food Insecurity: No Food Insecurity (01/10/2024)   Hunger Vital Sign    Worried About Running Out of Food in the Last Year: Never true    Ran Out of Food in the Last Year: Never true  Transportation Needs: No Transportation Needs (01/10/2024)   PRAPARE - Administrator, Civil Service (Medical): No    Lack of Transportation (Non-Medical): No  Physical Activity: Sufficiently Active (01/10/2024)   Exercise Vital Sign    Days of Exercise per Week: 5 days    Minutes of Exercise per Session: 120 min  Stress: No Stress Concern Present (01/10/2024)   [employer] of Occupational Health - Occupational Stress Questionnaire    Feeling of Stress: Only a little  Social Connections: Moderately Integrated (01/10/2024)   Social Connection and Isolation Panel    Frequency of Communication with Friends and Family: More than three times a week    Frequency of Social Gatherings with Friends and Family: Three times a week    Attends Religious Services: More than 4 times per year    Active Member of Clubs or Organizations: No    Attends Engineer, structural: Not on file    Marital Status: Married  Catering manager Violence: Not on file   Social History   Tobacco Use  Smoking Status Never  Smokeless Tobacco Never   Social History   Substance and Sexual Activity  Alcohol Use No    Family History:  Family History  Problem Relation Age of Onset   Osteoporosis Mother    Cancer Father        non-hodgkin lymphoma   COPD Father    Hypertension Father    Obesity Father    Allergies Brother    Diabetes type I Son     ADD / ADHD Son    Depression Son    Diabetes Son  Learning disabilities Son    CAD Paternal Grandfather    Diverticulitis Paternal Grandfather    Colon cancer Neg Hx    Esophageal cancer Neg Hx    Liver cancer Neg Hx    Stomach cancer Neg Hx     Past medical history, surgical history, medications, allergies, family history and social history reviewed with patient today and changes made to appropriate areas of the chart.   Review of Systems  Constitutional:  Positive for malaise/fatigue. Negative for fever.  HENT: Negative.    Eyes: Negative.   Respiratory: Negative.    Cardiovascular: Negative.   Gastrointestinal: Negative.   Genitourinary: Negative.   Musculoskeletal:  Positive for joint pain (knees).  Skin: Negative.   Neurological: Negative.   Psychiatric/Behavioral: Negative.     All other ROS negative except what is listed above and in the HPI.      Objective:    BP 122/88 (BP Location: Left Arm, Patient Position: Sitting, Cuff Size: Normal)   Pulse 79   Temp (!) 96.9 F (36.1 C)   Ht 5' 6.5 (1.689 m)   Wt 199 lb 9.6 oz (90.5 kg)   SpO2 96%   BMI 31.73 kg/m   Wt Readings from Last 3 Encounters:  01/11/24 199 lb 9.6 oz (90.5 kg)  07/17/23 196 lb 6.4 oz (89.1 kg)  07/02/23 191 lb (86.6 kg)    Physical Exam Vitals and nursing note reviewed.  Constitutional:      General: He is not in acute distress.    Appearance: Normal appearance.  HENT:     Head: Normocephalic and atraumatic.     Right Ear: Tympanic membrane, ear canal and external ear normal.     Left Ear: Tympanic membrane, ear canal and external ear normal.     Mouth/Throat:     Mouth: Mucous membranes are moist.     Pharynx: No posterior oropharyngeal erythema.  Eyes:     Conjunctiva/sclera: Conjunctivae normal.  Cardiovascular:     Rate and Rhythm: Normal rate and regular rhythm.     Pulses: Normal pulses.     Heart sounds: Normal heart sounds.  Pulmonary:     Effort: Pulmonary effort is  normal.     Breath sounds: Normal breath sounds.  Abdominal:     Palpations: Abdomen is soft.     Tenderness: There is no abdominal tenderness.  Musculoskeletal:        General: Normal range of motion.     Cervical back: Normal range of motion and neck supple. No tenderness.     Right lower leg: No edema.     Left lower leg: No edema.  Lymphadenopathy:     Cervical: No cervical adenopathy.  Skin:    General: Skin is warm and dry.  Neurological:     General: No focal deficit present.     Mental Status: He is alert and oriented to person, place, and time.     Cranial Nerves: No cranial nerve deficit.     Coordination: Coordination normal.     Gait: Gait normal.  Psychiatric:        Mood and Affect: Mood normal.        Behavior: Behavior normal.        Thought Content: Thought content normal.        Judgment: Judgment normal.     Results for orders placed or performed in visit on 03/12/23  CBC and differential   Collection Time: 03/04/23 12:00 AM  Result Value  Ref Range   WBC 6.8    PSA, Total 0.94   CBC and differential   Collection Time: 03/04/23 12:00 AM  Result Value Ref Range   Hemoglobin 15.6 13.5 - 17.5   HCT 52 41 - 53   Neutrophils Absolute 3,686.00    Platelets 260 150 - 400 K/uL  CBC   Collection Time: 03/04/23 12:00 AM  Result Value Ref Range   RBC 7.87 (A) 3.87 - 5.11  TSH   Collection Time: 03/04/23 12:00 AM  Result Value Ref Range   TSH 1.25 0.41 - 5.90      Assessment & Plan:   Problem List Items Addressed This Visit       Other   Low testosterone    Chronic, stable. He is currently getting testosterone  pellets from a specialist. PSA and hemoglobin stable.      Routine general medical examination at a health care facility - Primary   Health maintenance reviewed and updated. Discussed nutrition, exercise. Reviewed labs from earlier this year. Follow-up 1 year.        Mixed hyperlipidemia   Chronic, stable. He exercises 3-4 days per week.  He will get his cholesterol drawn with his next labs for testosterone  replacement.       Erectile dysfunction   Chronic, stable. Continue sildenafil  50mg  as needed.         LABORATORY TESTING:  Health maintenance labs ordered today as discussed above.   The natural history of prostate cancer and ongoing controversy regarding screening and potential treatment outcomes of prostate cancer has been discussed with the patient. The meaning of a false positive PSA and a false negative PSA has been discussed. He indicates understanding of the limitations of this screening test and wishes to proceed with screening PSA testing.   IMMUNIZATIONS:   - Tdap: Tetanus vaccination status reviewed: last tetanus booster within 10 years. - Influenza: Postponed to flu season - Pneumovax: Not applicable - Prevnar: Not applicable - HPV: Not applicable - Shingrix vaccine: Declined  SCREENING: - Colonoscopy: Up to date  Discussed with patient purpose of the colonoscopy is to detect colon cancer at curable precancerous or early stages   - AAA Screening: Not applicable   PATIENT COUNSELING:    Sexuality: Discussed sexually transmitted diseases, partner selection, use of condoms, avoidance of unintended pregnancy  and contraceptive alternatives.   Advised to avoid cigarette smoking.  I discussed with the patient that most people either abstain from alcohol or drink within safe limits (<=14/week and <=4 drinks/occasion for males, <=7/weeks and <= 3 drinks/occasion for females) and that the risk for alcohol disorders and other health effects rises proportionally with the number of drinks per week and how often a drinker exceeds daily limits.  Discussed cessation/primary prevention of drug use and availability of treatment for abuse.   Diet: Encouraged to adjust caloric intake to maintain  or achieve ideal body weight, to reduce intake of dietary saturated fat and total fat, to limit sodium intake by  avoiding high sodium foods and not adding table salt, and to maintain adequate dietary potassium and calcium preferably from fresh fruits, vegetables, and low-fat dairy products.    stressed the importance of regular exercise  Injury prevention: Discussed safety belts, safety helmets, smoke detector, smoking near bedding or upholstery.   Dental health: Discussed importance of regular tooth brushing, flossing, and dental visits.   Follow up plan: NEXT PREVENTATIVE PHYSICAL DUE IN 1 YEAR. Return in about 1 year (around 01/10/2025) for CPE.  Arlena Marsan A Searra Carnathan

## 2024-01-11 NOTE — Assessment & Plan Note (Signed)
 Health maintenance reviewed and updated. Discussed nutrition, exercise. Reviewed labs from earlier this year. Follow-up 1 year.

## 2024-01-11 NOTE — Assessment & Plan Note (Signed)
 Chronic, stable. Continue sildenafil  50mg  as needed.

## 2024-01-11 NOTE — Patient Instructions (Signed)
It was great to see you!  Keep up the great work!   Let's follow-up in 1 year, sooner if you have concerns.  If a referral was placed today, you will be contacted for an appointment. Please note that routine referrals can sometimes take up to 3-4 weeks to process. Please call our office if you haven't heard anything after this time frame.  Take care,  Adelise Buswell, NP  

## 2024-01-11 NOTE — Assessment & Plan Note (Signed)
 Chronic, stable. He exercises 3-4 days per week. He will get his cholesterol drawn with his next labs for testosterone  replacement.

## 2024-01-11 NOTE — Assessment & Plan Note (Signed)
 Chronic, stable. He is currently getting testosterone  pellets from a specialist. PSA and hemoglobin stable.

## 2024-01-12 ENCOUNTER — Encounter: Payer: Self-pay | Admitting: Nurse Practitioner

## 2024-02-25 LAB — CBC AND DIFFERENTIAL
HCT: 53 (ref 41–53)
Hemoglobin: 16.3 (ref 13.5–17.5)
Neutrophils Absolute: 4321
Platelets: 286 K/uL (ref 150–400)
WBC: 7.9

## 2024-02-25 LAB — LIPID PANEL
Cholesterol: 143 (ref 0–200)
HDL: 34 — AB (ref 35–70)
LDL Cholesterol: 82
LDl/HDL Ratio: 4.2
Triglycerides: 175 — AB (ref 40–160)

## 2024-02-25 LAB — PSA: PSA: 1.23

## 2024-02-25 LAB — CBC: RBC: 8.14 — AB (ref 3.87–5.11)

## 2024-03-08 ENCOUNTER — Encounter: Payer: Self-pay | Admitting: Nurse Practitioner

## 2024-03-08 DIAGNOSIS — E785 Hyperlipidemia, unspecified: Secondary | ICD-10-CM | POA: Diagnosis not present

## 2024-03-08 DIAGNOSIS — R5383 Other fatigue: Secondary | ICD-10-CM | POA: Diagnosis not present

## 2024-03-08 DIAGNOSIS — E291 Testicular hypofunction: Secondary | ICD-10-CM | POA: Diagnosis not present

## 2024-03-08 DIAGNOSIS — N529 Male erectile dysfunction, unspecified: Secondary | ICD-10-CM | POA: Diagnosis not present

## 2024-03-08 DIAGNOSIS — R790 Abnormal level of blood mineral: Secondary | ICD-10-CM | POA: Diagnosis not present

## 2024-03-08 DIAGNOSIS — Z79890 Hormone replacement therapy (postmenopausal): Secondary | ICD-10-CM | POA: Diagnosis not present

## 2024-03-08 DIAGNOSIS — F5101 Primary insomnia: Secondary | ICD-10-CM | POA: Diagnosis not present

## 2024-03-08 DIAGNOSIS — G4709 Other insomnia: Secondary | ICD-10-CM | POA: Diagnosis not present

## 2024-03-08 DIAGNOSIS — Z6832 Body mass index (BMI) 32.0-32.9, adult: Secondary | ICD-10-CM | POA: Diagnosis not present

## 2024-03-08 DIAGNOSIS — R6882 Decreased libido: Secondary | ICD-10-CM | POA: Diagnosis not present

## 2024-04-25 ENCOUNTER — Encounter: Payer: Self-pay | Admitting: Nurse Practitioner
